# Patient Record
Sex: Female | Born: 1990 | Race: Black or African American | Hispanic: No | Marital: Single | Smoking: Never smoker
Health system: Southern US, Community
[De-identification: ages and names within clinical notes are randomized; demographics above are authoritative.]

## PROBLEM LIST (undated history)

## (undated) DIAGNOSIS — E0590 Thyrotoxicosis, unspecified without thyrotoxic crisis or storm: Secondary | ICD-10-CM

## (undated) DIAGNOSIS — D509 Iron deficiency anemia, unspecified: Secondary | ICD-10-CM

## (undated) DIAGNOSIS — R51 Headache: Secondary | ICD-10-CM

## (undated) DIAGNOSIS — R519 Headache, unspecified: Secondary | ICD-10-CM

## (undated) HISTORY — DX: Thyrotoxicosis, unspecified without thyrotoxic crisis or storm: E05.90

## (undated) HISTORY — DX: Iron deficiency anemia, unspecified: D50.9

---

## 2013-03-30 ENCOUNTER — Emergency Department (HOSPITAL_COMMUNITY)
Admission: EM | Admit: 2013-03-30 | Discharge: 2013-03-30 | Disposition: A | Discharge status: Home / Self Care | Payer: 59 | Attending: Emergency Medicine | Admitting: Emergency Medicine

## 2013-03-30 ENCOUNTER — Encounter (HOSPITAL_COMMUNITY): Payer: Self-pay | Admitting: Emergency Medicine

## 2013-03-30 DIAGNOSIS — K219 Gastro-esophageal reflux disease without esophagitis: Secondary | ICD-10-CM

## 2013-03-30 DIAGNOSIS — Z79899 Other long term (current) drug therapy: Secondary | ICD-10-CM | POA: Insufficient documentation

## 2013-03-30 DIAGNOSIS — Z3202 Encounter for pregnancy test, result negative: Secondary | ICD-10-CM | POA: Insufficient documentation

## 2013-03-30 LAB — CBC WITH DIFFERENTIAL/PLATELET
BASOS ABS: 0 10*3/uL (ref 0.0–0.1)
Basophils Relative: 0 % (ref 0–1)
EOS PCT: 2 % (ref 0–5)
Eosinophils Absolute: 0.1 10*3/uL (ref 0.0–0.7)
HCT: 37.6 % (ref 36.0–46.0)
Hemoglobin: 12.3 g/dL (ref 12.0–15.0)
LYMPHS PCT: 36 % (ref 12–46)
Lymphs Abs: 2.3 10*3/uL (ref 0.7–4.0)
MCH: 26.3 pg (ref 26.0–34.0)
MCHC: 32.7 g/dL (ref 30.0–36.0)
MCV: 80.3 fL (ref 78.0–100.0)
Monocytes Absolute: 0.5 10*3/uL (ref 0.1–1.0)
Monocytes Relative: 8 % (ref 3–12)
NEUTROS ABS: 3.5 10*3/uL (ref 1.7–7.7)
NEUTROS PCT: 54 % (ref 43–77)
PLATELETS: 242 10*3/uL (ref 150–400)
RBC: 4.68 MIL/uL (ref 3.87–5.11)
RDW: 14.8 % (ref 11.5–15.5)
WBC: 6.5 10*3/uL (ref 4.0–10.5)

## 2013-03-30 LAB — URINALYSIS, ROUTINE W REFLEX MICROSCOPIC
BILIRUBIN URINE: NEGATIVE
Glucose, UA: NEGATIVE mg/dL
Ketones, ur: NEGATIVE mg/dL
Leukocytes, UA: NEGATIVE
Nitrite: NEGATIVE
Protein, ur: NEGATIVE mg/dL
SPECIFIC GRAVITY, URINE: 1.027 (ref 1.005–1.030)
Urobilinogen, UA: 1 mg/dL (ref 0.0–1.0)
pH: 6 (ref 5.0–8.0)

## 2013-03-30 LAB — LIPASE, BLOOD: Lipase: 18 U/L (ref 11–59)

## 2013-03-30 LAB — COMPREHENSIVE METABOLIC PANEL
ALK PHOS: 83 U/L (ref 39–117)
ALT: 8 U/L (ref 0–35)
AST: 14 U/L (ref 0–37)
Albumin: 3 g/dL — ABNORMAL LOW (ref 3.5–5.2)
BUN: 15 mg/dL (ref 6–23)
CALCIUM: 8.7 mg/dL (ref 8.4–10.5)
CO2: 21 mEq/L (ref 19–32)
Chloride: 107 mEq/L (ref 96–112)
Creatinine, Ser: 0.89 mg/dL (ref 0.50–1.10)
GFR calc Af Amer: >90 mL/min (ref >90)
GFR calc non Af Amer: >90 mL/min (ref >90)
Glucose, Bld: 121 mg/dL — ABNORMAL HIGH (ref 70–99)
POTASSIUM: 3.9 meq/L (ref 3.7–5.3)
Sodium: 141 mEq/L (ref 137–147)
Total Bilirubin: <0.2 mg/dL — ABNORMAL LOW (ref 0.3–1.2)
Total Protein: 7.4 g/dL (ref 6.0–8.3)

## 2013-03-30 LAB — URINE MICROSCOPIC-ADD ON

## 2013-03-30 LAB — POCT I-STAT TROPONIN I: TROPONIN I, POC: 0 ng/mL (ref 0.00–0.08)

## 2013-03-30 LAB — POCT PREGNANCY, URINE: PREG TEST UR: NEGATIVE

## 2013-03-30 MED ORDER — GI COCKTAIL ~~LOC~~
30.0000 mL | Freq: Once | ORAL | Status: AC
  Administered 2013-03-30: 30 mL via ORAL
  Filled 2013-03-30: qty 30

## 2013-03-30 MED ORDER — KETOROLAC TROMETHAMINE 30 MG/ML IJ SOLN
30.0000 mg | Freq: Once | INTRAMUSCULAR | Status: AC
  Administered 2013-03-30: 30 mg via INTRAVENOUS
  Filled 2013-03-30: qty 1

## 2013-03-30 MED ORDER — ONDANSETRON HCL 4 MG/2ML IJ SOLN
4.0000 mg | Freq: Once | INTRAMUSCULAR | Status: AC
  Administered 2013-03-30: 4 mg via INTRAVENOUS
  Filled 2013-03-30: qty 2

## 2013-03-30 MED ORDER — DICYCLOMINE HCL 10 MG/ML IM SOLN
20.0000 mg | Freq: Once | INTRAMUSCULAR | Status: AC
  Administered 2013-03-30: 20 mg via INTRAMUSCULAR
  Filled 2013-03-30: qty 2

## 2013-03-30 MED ORDER — SUCRALFATE 1 GM/10ML PO SUSP
1.0000 g | Freq: Three times a day (TID) | ORAL | Status: DC
Start: 2013-03-30 — End: 2013-10-03

## 2013-03-30 NOTE — ED Provider Notes (Signed)
CSN: [id number]     Arrival date & time 03/30/13  0321 History   First MD Initiated Contact with Patient 03/30/13 [phone]     Chief Complaint  Patient presents with  . Abdominal Pain   (Consider location/radiation/quality/duration/timing/severity/associated sxs/prior Treatment) Patient is a 23 y.o. female presenting with abdominal pain. The history is provided by the patient. No language interpreter was used.  Abdominal Pain Pain location:  Epigastric (distribution of the colon) Pain quality: bloating and cramping   Pain radiates to:  Does not radiate Pain severity:  Moderate Onset quality:  Gradual Timing:  Constant Progression:  Unchanged Chronicity:  New Context: eating   Context comment:  Chilis hot wings and artichocke dip Relieved by:  Nothing Worsened by:  Nothing tried Ineffective treatments:  None tried Associated symptoms: vomiting   Associated symptoms: no anorexia   Risk factors: not pregnant     History reviewed. No pertinent past medical history. History reviewed. No pertinent past surgical history. No family history on file. History  Substance Use Topics  . Smoking status: Never Smoker   . Smokeless tobacco: Not on file  . Alcohol Use: No   OB History   Grav Para Term Preterm Abortions TAB SAB Ect Mult Living                 Review of Systems  Gastrointestinal: Positive for vomiting and abdominal pain. Negative for anorexia.  All other systems reviewed and are negative.    Allergies  Review of patient's allergies indicates no known allergies.  Home Medications   Current Outpatient Rx  Name  Route  Sig  Dispense  Refill  . AMETHIA 0.15-0.03 &0.01 MG tablet   Oral   Take 1 tablet by mouth daily.          BP 149/82  Pulse 71  Temp(Src) 97.9 F (36.6 C) (Oral)  Resp 18  Ht 5\' 6"  (1.676 m)  Wt 224 lb (101.606 kg)  BMI 36.17 kg/m2  SpO2 98%  LMP 01/27/2013 Physical Exam  Constitutional: She is oriented to person, place, and time. She  appears well-developed.  HENT:  Head: Normocephalic and atraumatic.  Mouth/Throat: Oropharynx is clear and moist.  Eyes: Conjunctivae are normal. Pupils are equal, round, and reactive to light.  Neck: Normal range of motion. Neck supple.  Cardiovascular: Normal rate and regular rhythm.   Pulmonary/Chest: Effort normal and breath sounds normal. She has no wheezes. She has no rales.  Abdominal: Soft. Bowel sounds are increased. There is no tenderness. There is no rigidity, no rebound, no guarding, no tenderness at McBurney's point and negative Murphy's sign.  Musculoskeletal: Normal range of motion.  Neurological: She is alert and oriented to person, place, and time.  Skin: Skin is warm and dry.    ED Course  Procedures (including critical care time) Labs Review Labs Reviewed  COMPREHENSIVE METABOLIC PANEL - Abnormal; Notable for the following:    Glucose, Bld 121 (*)    Albumin 3.0 (*)    Total Bilirubin <0.2 (*)    All other components within normal limits  URINALYSIS, ROUTINE W REFLEX MICROSCOPIC - Abnormal; Notable for the following:    APPearance CLOUDY (*)    Hgb urine dipstick TRACE (*)    All other components within normal limits  URINE MICROSCOPIC-ADD ON - Abnormal; Notable for the following:    Squamous Epithelial / LPF FEW (*)    Bacteria, UA FEW (*)    All other components within normal limits  CBC  WITH DIFFERENTIAL  LIPASE, BLOOD  POCT PREGNANCY, URINE  POCT I-STAT TROPONIN I   Imaging Review No results found.  EKG Interpretation    Date/Time:  Monday March 30 2013 03:39:59 EST Ventricular Rate:  83 PR Interval:  148 QRS Duration: 84 QT Interval:  368 QTC Calculation: 432 R Axis:   40 Text Interpretation:  Normal sinus rhythm Confirmed by [facility]  [facility], Garron Eline ([id number]) on 03/30/2013 4:35:29 AM            MDM  No diagnosis found. Symptoms consistent with GERD and gas.  No indication for CT at this time.  Will treat symptomatically return for any  worsening symptoms    Dmitriy Gair K Louis Ivery-Rasch, MD 03/30/13 [phone]

## 2013-03-30 NOTE — ED Notes (Signed)
C/o upper abd pain that woke her up approx 1 hour ago. Pt states it feels like gas pain. Vomited x 2.  Denies nausea at present.  Last BM Sunday- normal.

## 2013-03-30 NOTE — ED Notes (Signed)
Pt reports upper abd pain that woke her from sleep. Stated vomited x 2, denies sob, nausea, or diarrhea. Denies hx of same.

## 2013-03-30 NOTE — Discharge Instructions (Signed)

## 2013-10-03 ENCOUNTER — Encounter (HOSPITAL_COMMUNITY): Payer: Self-pay | Admitting: Emergency Medicine

## 2013-10-03 ENCOUNTER — Emergency Department (HOSPITAL_COMMUNITY)
Admission: EM | Admit: 2013-10-03 | Discharge: 2013-10-03 | Disposition: A | Discharge status: Home / Self Care | Payer: 59 | Source: Home / Self Care | Attending: Family Medicine | Admitting: Family Medicine

## 2013-10-03 ENCOUNTER — Emergency Department (HOSPITAL_COMMUNITY): Payer: 59

## 2013-10-03 ENCOUNTER — Emergency Department (HOSPITAL_COMMUNITY)
Admission: EM | Admit: 2013-10-03 | Discharge: 2013-10-03 | Disposition: A | Discharge status: Home / Self Care | Payer: 59 | Attending: Emergency Medicine | Admitting: Emergency Medicine

## 2013-10-03 DIAGNOSIS — R1011 Right upper quadrant pain: Secondary | ICD-10-CM | POA: Insufficient documentation

## 2013-10-03 DIAGNOSIS — R197 Diarrhea, unspecified: Secondary | ICD-10-CM | POA: Diagnosis not present

## 2013-10-03 DIAGNOSIS — Z79899 Other long term (current) drug therapy: Secondary | ICD-10-CM | POA: Insufficient documentation

## 2013-10-03 DIAGNOSIS — K8020 Calculus of gallbladder without cholecystitis without obstruction: Secondary | ICD-10-CM

## 2013-10-03 DIAGNOSIS — R112 Nausea with vomiting, unspecified: Secondary | ICD-10-CM

## 2013-10-03 DIAGNOSIS — R1013 Epigastric pain: Secondary | ICD-10-CM | POA: Diagnosis not present

## 2013-10-03 LAB — CBC WITH DIFFERENTIAL/PLATELET
BASOS ABS: 0 10*3/uL (ref 0.0–0.1)
Basophils Relative: 0 % (ref 0–1)
EOS PCT: 0 % (ref 0–5)
Eosinophils Absolute: 0 10*3/uL (ref 0.0–0.7)
HCT: 42.4 % (ref 36.0–46.0)
Hemoglobin: 13.9 g/dL (ref 12.0–15.0)
LYMPHS ABS: 0.7 10*3/uL (ref 0.7–4.0)
Lymphocytes Relative: 13 % (ref 12–46)
MCH: 27.1 pg (ref 26.0–34.0)
MCHC: 32.8 g/dL (ref 30.0–36.0)
MCV: 82.7 fL (ref 78.0–100.0)
Monocytes Absolute: 0.6 10*3/uL (ref 0.1–1.0)
Monocytes Relative: 12 % (ref 3–12)
Neutro Abs: 3.8 10*3/uL (ref 1.7–7.7)
Neutrophils Relative %: 75 % (ref 43–77)
PLATELETS: 254 10*3/uL (ref 150–400)
RBC: 5.13 MIL/uL — ABNORMAL HIGH (ref 3.87–5.11)
RDW: 14.2 % (ref 11.5–15.5)
WBC: 5.1 10*3/uL (ref 4.0–10.5)

## 2013-10-03 LAB — COMPREHENSIVE METABOLIC PANEL
ALT: 18 U/L (ref 0–35)
AST: 23 U/L (ref 0–37)
Albumin: 3.8 g/dL (ref 3.5–5.2)
Alkaline Phosphatase: 99 U/L (ref 39–117)
Anion gap: 14 (ref 5–15)
BUN: 10 mg/dL (ref 6–23)
CALCIUM: 9.6 mg/dL (ref 8.4–10.5)
CO2: 23 mEq/L (ref 19–32)
Chloride: 100 mEq/L (ref 96–112)
Creatinine, Ser: 0.79 mg/dL (ref 0.50–1.10)
GFR calc non Af Amer: >90 mL/min (ref >90)
GLUCOSE: 95 mg/dL (ref 70–99)
Potassium: 4.1 mEq/L (ref 3.7–5.3)
SODIUM: 137 meq/L (ref 137–147)
TOTAL PROTEIN: 8.3 g/dL (ref 6.0–8.3)
Total Bilirubin: 0.2 mg/dL — ABNORMAL LOW (ref 0.3–1.2)

## 2013-10-03 LAB — POCT URINALYSIS DIP (DEVICE)
Bilirubin Urine: NEGATIVE
Glucose, UA: NEGATIVE mg/dL
HGB URINE DIPSTICK: NEGATIVE
Ketones, ur: NEGATIVE mg/dL
Leukocytes, UA: NEGATIVE
NITRITE: NEGATIVE
PH: 7 (ref 5.0–8.0)
Protein, ur: NEGATIVE mg/dL
Specific Gravity, Urine: 1.02 (ref 1.005–1.030)
UROBILINOGEN UA: 0.2 mg/dL (ref 0.0–1.0)

## 2013-10-03 LAB — LIPASE, BLOOD: Lipase: 19 U/L (ref 11–59)

## 2013-10-03 LAB — POCT PREGNANCY, URINE: Preg Test, Ur: NEGATIVE

## 2013-10-03 MED ORDER — MORPHINE SULFATE 4 MG/ML IJ SOLN
4.0000 mg | Freq: Once | INTRAMUSCULAR | Status: AC
  Administered 2013-10-03: 4 mg via INTRAVENOUS
  Filled 2013-10-03: qty 1

## 2013-10-03 MED ORDER — DICYCLOMINE HCL 20 MG PO TABS: 20.0000 mg | ORAL_TABLET | Freq: Two times a day (BID) | ORAL | Status: DC

## 2013-10-03 MED ORDER — ONDANSETRON HCL 4 MG/2ML IJ SOLN
4.0000 mg | Freq: Once | INTRAMUSCULAR | Status: AC
  Administered 2013-10-03: 4 mg via INTRAVENOUS
  Filled 2013-10-03: qty 2

## 2013-10-03 MED ORDER — HYDROCODONE-ACETAMINOPHEN 5-325 MG PO TABS: 1.0000 | ORAL_TABLET | Freq: Four times a day (QID) | ORAL | Status: DC | PRN

## 2013-10-03 MED ORDER — SODIUM CHLORIDE 0.9 % IV BOLUS (SEPSIS)
1000.0000 mL | Freq: Once | INTRAVENOUS | Status: AC
  Administered 2013-10-03: 1000 mL via INTRAVENOUS

## 2013-10-03 MED ORDER — DICYCLOMINE HCL 10 MG/ML IM SOLN
20.0000 mg | Freq: Once | INTRAMUSCULAR | Status: AC
  Administered 2013-10-03: 20 mg via INTRAMUSCULAR
  Filled 2013-10-03: qty 2

## 2013-10-03 NOTE — ED Notes (Signed)
Brought pt back to room with family in tow; pt getting undressed and into a gown at this time; family at bedside; Lowella BandyNikki, RN aware of pt

## 2013-10-03 NOTE — ED Notes (Signed)
Pt c/o abd pain onset 0500 today Sx include n/v Denise fevers, urinary sx Alert w/no signs of acute distress.

## 2013-10-03 NOTE — ED Provider Notes (Signed)
CSN: [id number]     Arrival date & time 10/03/13  1122 History   First MD Initiated Contact with Patient 10/03/13 1153     Chief Complaint  Patient presents with  . Abdominal Pain   (Consider location/radiation/quality/duration/timing/severity/associated sxs/prior Treatment) HPI Comments: 23 year old female presents for evaluation of abdominal pain. She had acute onset of upper abdominal pain at 5:00 this morning that woke her from her sleep. The pain is described as sharp and stabbing. It was soon followed by nausea and vomiting, and since then she has developed diarrhea. She has had multiple episodes of vomiting today. The pain is located in the epigastrium and right upper quadrant and is nonradiating. She has not had any fever at home. She has never had pain like this in the past. Vomitus is nonbilious  Patient is a 23 y.o. female presenting with abdominal pain.  Abdominal Pain Associated symptoms: diarrhea, nausea and vomiting     History reviewed. No pertinent past medical history. History reviewed. No pertinent past surgical history. No family history on file. History  Substance Use Topics  . Smoking status: Never Smoker   . Smokeless tobacco: Not on file  . Alcohol Use: No   OB History   Grav Para Term Preterm Abortions TAB SAB Ect Mult Living                 Review of Systems  Gastrointestinal: Positive for nausea, vomiting, abdominal pain and diarrhea. Negative for blood in stool.  All other systems reviewed and are negative.   Allergies  Review of patient's allergies indicates no known allergies.  Home Medications   Prior to Admission medications   Medication Sig Start Date End Date Taking? Authorizing Provider  AMETHIA 0.15-0.03 &0.01 MG tablet Take 1 tablet by mouth daily. 02/01/13  Yes Historical Provider, MD  sucralfate (CARAFATE) 1 GM/10ML suspension Take 10 mLs (1 g total) by mouth 4 (four) times daily -  with meals and at bedtime. 03/30/13   April K  Palumbo-Rasch, MD   BP 135/87  Pulse 61  Temp(Src) 98.6 F (37 C) (Oral)  Resp 16  SpO2 100%  LMP 08/08/2013 Physical Exam  Nursing note and vitals reviewed. Constitutional: She is oriented to person, place, and time. Vital signs are normal. She appears well-developed and well-nourished. No distress.  HENT:  Head: Normocephalic and atraumatic.  Cardiovascular: Normal rate, regular rhythm and normal heart sounds.   Pulmonary/Chest: Effort normal and breath sounds normal. No respiratory distress.  Abdominal: Soft. Normal appearance. Bowel sounds are decreased. There is no hepatosplenomegaly. There is tenderness in the right upper quadrant and epigastric area. There is positive Murphy's sign. There is no rigidity, no rebound, no guarding, no CVA tenderness and no tenderness at McBurney's point. No hernia.  Neurological: She is alert and oriented to person, place, and time. She has normal strength. Coordination normal.  Skin: Skin is warm and dry. No rash noted. She is not diaphoretic.  Psychiatric: She has a normal mood and affect. Judgment normal.    ED Course  Procedures (including critical care time) Labs Review Labs Reviewed  POCT URINALYSIS DIP (DEVICE)  POCT PREGNANCY, URINE    Imaging Review No results found.   MDM   1. Right upper quadrant pain   2. Nausea vomiting and diarrhea    Abdominal pain proceeded the vomiting and diarrhea, cannot r/o acute abdomen without US or CT  as part of workup, transferred to ED via shuttle .  Graylon Good, PA-C 10/03/13 1248

## 2013-10-03 NOTE — ED Provider Notes (Signed)
Medical screening examination/treatment/procedure(s) were performed by resident physician or non-physician practitioner and as supervising physician I was immediately available for consultation/collaboration.   Barkley BrunsKINDL,Charistopher Rumble DOUGLAS MD.   Linna HoffJames D Oprah Camarena, MD 10/03/13 [phone]

## 2013-10-03 NOTE — ED Provider Notes (Signed)
CSN: [id number]     Arrival date & time 10/03/13  1251 History   None    Chief Complaint  Patient presents with  . Abdominal Pain     (Consider location/radiation/quality/duration/timing/severity/associated sxs/prior Treatment) HPI Comments: Patient is a 23 year old female who presents to the emergency department today from urgent care for evaluation of abdominal pain. She developed a sharp RUQ abdominal pain at 5am which awoke her from sleep. The pain has been constant since she woke up. Nothing seems to make her pain worse. She has not had any medications to improve her pain. She has associated nausea, vomiting, diarrhea. Diarrhea is watery. Emesis is nonbilious, non bloody. She denies fevers or chills. She has never had pain like this in the past. No dysuria, urinary urgency, urinary frequency, vaginal discharge, vaginal bleeding.  She denies any abdominal surgeries.  Patient is a 23 y.o. female presenting with abdominal pain. The history is provided by the patient. No language interpreter was used.  Abdominal Pain Associated symptoms: diarrhea, nausea and vomiting   Associated symptoms: no chest pain, no chills, no dysuria, no fever, no shortness of breath, no vaginal bleeding and no vaginal discharge     History reviewed. No pertinent past medical history. History reviewed. No pertinent past surgical history. No family history on file. History  Substance Use Topics  . Smoking status: Never Smoker   . Smokeless tobacco: Not on file  . Alcohol Use: No   OB History   Grav Para Term Preterm Abortions TAB SAB Ect Mult Living                 Review of Systems  Constitutional: Negative for fever and chills.  Respiratory: Negative for shortness of breath.   Cardiovascular: Negative for chest pain.  Gastrointestinal: Positive for nausea, vomiting, abdominal pain and diarrhea.  Genitourinary: Negative for dysuria, vaginal bleeding, vaginal discharge, difficulty urinating and genital  sores.  All other systems reviewed and are negative.     Allergies  Review of patient's allergies indicates no known allergies.  Home Medications   Prior to Admission medications   Medication Sig Start Date End Date Taking? Authorizing Provider  AMETHIA 0.15-0.03 &0.01 MG tablet Take 1 tablet by mouth daily. 02/01/13  Yes Historical Provider, MD   BP 129/82  Pulse 63  Temp(Src) 98.2 F (36.8 C) (Oral)  Resp 18  SpO2 100%  LMP 08/08/2013 Physical Exam  Nursing note and vitals reviewed. Constitutional: She is oriented to person, place, and time. She appears well-developed and well-nourished. No distress.  HENT:  Head: Normocephalic and atraumatic.  Right Ear: External ear normal.  Left Ear: External ear normal.  Nose: Nose normal.  Mouth/Throat: Oropharynx is clear and moist.  Eyes: Conjunctivae are normal.  Neck: Normal range of motion.  Cardiovascular: Normal rate, regular rhythm and normal heart sounds.   Pulmonary/Chest: Effort normal and breath sounds normal. No stridor. No respiratory distress. She has no wheezes. She has no rales.  Abdominal: Soft. She exhibits no distension. There is tenderness in the right upper quadrant and epigastric area. There is no rigidity, no rebound, no guarding and negative Murphy's sign.  Musculoskeletal: Normal range of motion.  Neurological: She is alert and oriented to person, place, and time. She has normal strength.  Skin: Skin is warm and dry. She is not diaphoretic. No erythema.  Psychiatric: She has a normal mood and affect. Her behavior is normal.    ED Course  Procedures (including critical care time) Labs Review  Labs Reviewed  CBC WITH DIFFERENTIAL - Abnormal; Notable for the following:    RBC 5.13 (*)    All other components within normal limits  COMPREHENSIVE METABOLIC PANEL - Abnormal; Notable for the following:    Total Bilirubin 0.2 (*)    All other components within normal limits  LIPASE, BLOOD    Imaging  Review US Abdomen Complete  10/03/2013   CLINICAL DATA:  Right upper quadrant abdominal pain.  EXAM: ULTRASOUND ABDOMEN COMPLETE  COMPARISON:  None.  FINDINGS: Gallbladder:  Gallstones, measuring up to 2.0 cm. No wall thickening or pericholecystic fluid. Sonographic Murphy's sign was not elicited.  Common bile duct:  Diameter: Normal, 4 mm.  Liver:  No focal lesion identified. Within normal limits in parenchymal echogenicity.  IVC:  No abnormality visualized.  Pancreas:  Poorly visualized due to overlying bowel gas.  Spleen:  Size and appearance within normal limits.  Right Kidney:  Length: 11.4 cm No hydronephrosis.  Left Kidney:  Length: 10.7 cm. No hydronephrosis. Partially obscured due to overlying bowel gas.  Abdominal aorta:  No aneurysm visualized.  Other findings:  None.  IMPRESSION: Cholelithiasis without acute cholecystitis or biliary ductal dilatation.   Electronically Signed   By: Jeronimo Greaves M.D.   On: 10/03/2013 17:06     EKG Interpretation None      MDM   Final diagnoses:  Calculus of gallbladder without cholecystitis without obstruction   Patient is nontoxic, nonseptic appearing, in no apparent distress.  Patient's pain and other symptoms adequately managed in emergency department. Labs, imaging and vitals reviewed.  Patient does not meet the SIRS or Sepsis criteria.  On repeat exam patient does not have a surgical abdomen and there are no peritoneal signs.  No indication of appendicitis, bowel obstruction, bowel perforation, cholecystitis, diverticulitis, PID or ectopic pregnancy.  Urine pregnancy and UA unremarkable at [facility]. Symptoms likely due to cholelithiasis. Discussed this with patient. Patient discharged home with symptomatic treatment. Will give surgery referral.  I have also discussed reasons to return immediately to the ER.  Patient expresses understanding and agrees with plan. Discussed case with Dr. Clarene Duke who agrees with plan. Patient / Family / Caregiver informed of  clinical course, understand medical decision-making process, and agree with plan.        Mora Bellman, PA-C 10/03/13 [phone]

## 2013-10-03 NOTE — ED Notes (Signed)
[facility] reports sudden onset of 10/10 RUQ "sharp" abdominal pain at 0500 today associated with N/V/D. Pt in NAD. Denies fever/chills.

## 2013-10-03 NOTE — ED Notes (Signed)
PA at bedside.

## 2013-10-03 NOTE — Discharge Instructions (Signed)
Low-Fat Diet for Pancreatitis or Gallbladder Conditions °A low-fat diet can be helpful if you have pancreatitis or a gallbladder condition. With these conditions, your pancreas and gallbladder have trouble digesting fats. A healthy eating plan with less fat will help rest your pancreas and gallbladder and reduce your symptoms. °WHAT DO I NEED TO KNOW ABOUT THIS DIET? °· Eat a low-fat diet. °¨ Reduce your fat intake to less than 20-30% of your total daily calories. This is less than 50-60 g of fat per day. °¨ Remember that you need some fat in your diet. Ask your dietician what your daily goal should be. °¨ Choose nonfat and low-fat healthy foods. Look for the words "nonfat," "low fat," or "fat free." °¨ As a guide, look on the label and choose foods with less than 3 g of fat per serving. Eat only one serving. °· Avoid alcohol. °· Do not smoke. If you need help quitting, talk with your health care provider. °· Eat small frequent meals instead of three large heavy meals. °WHAT FOODS CAN I EAT? °Grains °Include healthy grains and starches such as potatoes, wheat bread, fiber-rich cereal, and brown rice. Choose whole grain options whenever possible. In adults, whole grains should account for 45-65% of your daily calories.  °Fruits and Vegetables °Eat plenty of fruits and vegetables. Fresh fruits and vegetables add fiber to your diet. °Meats and Other Protein Sources °Eat lean meat such as chicken and pork. Trim any fat off of meat before cooking it. Eggs, fish, and beans are other sources of protein. In adults, these foods should account for 10-35% of your daily calories. °Dairy °Choose low-fat milk and dairy options. Dairy includes fat and protein, as well as calcium.  °Fats and Oils °Limit high-fat foods such as fried foods, sweets, baked goods, sugary drinks.  °Other °Creamy sauces and condiments, such as mayonnaise, can add extra fat. Think about whether or not you need to use them, or use smaller amounts or low fat  options. °WHAT FOODS ARE NOT RECOMMENDED? °· High fat foods, such as: °¨ Baked goods. °¨ Ice cream. °¨ French toast. °¨ Sweet rolls. °¨ Pizza. °¨ Cheese bread. °¨ Foods covered with batter, butter, creamy sauces, or cheese. °¨ Fried foods. °¨ Sugary drinks and desserts. °· Foods that cause gas or bloating °Document Released: 02/10/2013 Document Reviewed: 02/10/2013 °[employer]® Patient Information ©2015 [employer]. This information is not intended to replace advice given to you by your health care provider. Make sure you discuss any questions you have with your health care provider. °Cholelithiasis °Cholelithiasis (also called gallstones) is a form of gallbladder disease. The gallbladder is a small organ that helps you digest fats. Symptoms of gallstones are: °· Feeling sick to your stomach (nausea). °· Throwing up (vomiting). °· Belly pain. °· Yellowing of the skin (jaundice). °· Sudden pain. You may feel the pain for minutes to hours. °· Fever. °· Pain to the touch. °HOME CARE °· Only take medicines as told by your doctor. °· Eat a low-fat diet until you see your doctor again. Eating fat can result in pain. °· Follow up with your doctor as told. Attacks usually happen time after time. Surgery is usually needed for permanent treatment. °GET HELP RIGHT AWAY IF:  °· Your pain gets worse. °· Your pain is not helped by medicines. °· You have a fever and lasting symptoms for more than 2-3 days. °· You have a fever and your symptoms suddenly get worse. °· You keep feeling sick to your   stomach and throwing up. °MAKE SURE YOU:  °· Understand these instructions. °· Will watch your condition. °· Will get help right away if you are not doing well or get worse. °Document Released: 07/25/2007 Document Revised: 10/08/2012 Document Reviewed: 07/30/2012 °Exit[employer]® Patient Information ©2015 [employer]. This information is not intended to replace advice given to you by your health care provider. Make sure you discuss any  questions you have with your health care provider. ° °

## 2013-10-05 NOTE — ED Provider Notes (Signed)
Medical screening examination/treatment/procedure(s) were performed by non-physician practitioner and as supervising physician I was immediately available for consultation/collaboration.   EKG Interpretation None        Samuel JesterKathleen Mahmud Keithly, DO 10/05/13 1543

## 2013-10-22 ENCOUNTER — Ambulatory Visit (INDEPENDENT_AMBULATORY_CARE_PROVIDER_SITE_OTHER): Payer: 59 | Admitting: General Surgery

## 2013-11-20 ENCOUNTER — Encounter (HOSPITAL_COMMUNITY): Payer: Self-pay | Admitting: Pharmacy Technician

## 2013-11-24 ENCOUNTER — Other Ambulatory Visit (HOSPITAL_COMMUNITY): Payer: Self-pay | Admitting: *Deleted

## 2013-11-24 NOTE — Pre-Procedure Instructions (Signed)
Susan GambleMarlayna J Schneider  11/24/2013   Your procedure is scheduled on:  Tuesday, December 01, 2013 at 12:10 PM.   [facility] at 10:10 AM.   Call this number if you have problems the morning of surgery: [phone]   Remember:   Do not eat food or drink liquids after midnight Monday, 11/30/13.   Take these medicines the morning of surgery with A SIP OF WATER: Levonorgestrel-Ethinyl Estradiol (AMETHIA)    Do not wear jewelry, make-up or nail polish.  Do not wear lotions, powders, or perfumes. You may wear deodorant.  Do not shave 48 hours prior to surgery.   Do not bring valuables to the hospital.  [facility]                  for any belongings or valuables.               Contacts, dentures or bridgework may not be worn into surgery.  Leave suitcase in the car. After surgery it may be brought to your room.  For patients admitted to the hospital, discharge time is determined by your                treatment team.               Patients discharged the day of surgery will not be allowed to drive home.    Special Instructions: [facility] - Preparing for Surgery  Before surgery, you can play an important role.  Because skin is not sterile, your skin needs to be as free of germs as possible.  You can reduce the number of germs on you skin by washing with CHG (chlorahexidine gluconate) soap before surgery.  CHG is an antiseptic cleaner which kills germs and bonds with the skin to continue killing germs even after washing.  Please DO NOT use if you have an allergy to CHG or antibacterial soaps.  If your skin becomes reddened/irritated stop using the CHG and inform your nurse when you arrive at Short Stay.  Do not shave (including legs and underarms) for at least 48 hours prior to the first CHG shower.  You may shave your face.  Please follow these instructions carefully:   1.  Shower with CHG Soap the night before surgery and  the                                morning of Surgery.  2.  If you choose to wash your hair, wash your hair first as usual with your       normal shampoo.  3.  After you shampoo, rinse your hair and body thoroughly to remove the                      Shampoo.  4.  Use CHG as you would any other liquid soap.  You can apply chg directly       to the skin and wash gently with scrungie or a clean washcloth.  5.  Apply the CHG Soap to your body ONLY FROM THE NECK DOWN.        Do not use on open wounds or open sores.  Avoid contact with your eyes, ears, mouth and genitals (private parts).  Wash genitals (private parts) with your normal soap.  6.  Wash thoroughly, paying special attention to the  area where your surgery        will be performed.  7.  Thoroughly rinse your body with warm water from the neck down.  8.  DO NOT shower/wash with your normal soap after using and rinsing off       the CHG Soap.  9.  Pat yourself dry with a clean towel.            10.  Wear clean pajamas.            11.  Place clean sheets on your bed the night of your first shower and do not        sleep with pets.  Day of Surgery  Do not apply any lotions the morning of surgery.  Please wear clean clothes to the hospital.     Please read over the following fact sheets that you were given: Pain Booklet, Coughing and Deep Breathing and Surgical Site Infection Prevention

## 2013-11-25 ENCOUNTER — Encounter (HOSPITAL_COMMUNITY)
Admission: RE | Admit: 2013-11-25 | Discharge: 2013-11-25 | Disposition: A | Discharge status: Home / Self Care | Payer: 59 | Source: Ambulatory Visit | Attending: General Surgery | Admitting: General Surgery

## 2013-11-25 ENCOUNTER — Encounter (HOSPITAL_COMMUNITY): Payer: Self-pay

## 2013-11-25 DIAGNOSIS — R51 Headache: Secondary | ICD-10-CM | POA: Diagnosis not present

## 2013-11-25 DIAGNOSIS — K8020 Calculus of gallbladder without cholecystitis without obstruction: Secondary | ICD-10-CM | POA: Diagnosis not present

## 2013-11-25 DIAGNOSIS — Z0000 Encounter for general adult medical examination without abnormal findings: Secondary | ICD-10-CM | POA: Insufficient documentation

## 2013-11-25 HISTORY — DX: Headache, unspecified: R51.9

## 2013-11-25 HISTORY — DX: Headache: R51

## 2013-11-25 LAB — CBC
HEMATOCRIT: 40.9 % (ref 36.0–46.0)
Hemoglobin: 13.4 g/dL (ref 12.0–15.0)
MCH: 27.3 pg (ref 26.0–34.0)
MCHC: 32.8 g/dL (ref 30.0–36.0)
MCV: 83.3 fL (ref 78.0–100.0)
Platelets: 256 10*3/uL (ref 150–400)
RBC: 4.91 MIL/uL (ref 3.87–5.11)
RDW: 13.9 % (ref 11.5–15.5)
WBC: 5.7 10*3/uL (ref 4.0–10.5)

## 2013-11-25 LAB — HCG, SERUM, QUALITATIVE: Preg, Serum: NEGATIVE

## 2013-11-26 ENCOUNTER — Other Ambulatory Visit (INDEPENDENT_AMBULATORY_CARE_PROVIDER_SITE_OTHER): Payer: Self-pay | Admitting: General Surgery

## 2013-11-30 MED ORDER — CEFOXITIN SODIUM 2 G IV SOLR
2.0000 g | INTRAVENOUS | Status: AC
  Administered 2013-12-01: 2 g via INTRAVENOUS
  Filled 2013-11-30 (×2): qty 2

## 2013-12-01 ENCOUNTER — Encounter (HOSPITAL_COMMUNITY): Payer: Self-pay

## 2013-12-01 ENCOUNTER — Ambulatory Visit (HOSPITAL_COMMUNITY): Payer: 59 | Admitting: Anesthesiology

## 2013-12-01 ENCOUNTER — Encounter (HOSPITAL_COMMUNITY): Payer: 59 | Admitting: Anesthesiology

## 2013-12-01 ENCOUNTER — Encounter (HOSPITAL_COMMUNITY)
Admission: RE | Disposition: A | Discharge status: Home / Self Care | Payer: Self-pay | Source: Ambulatory Visit | Attending: General Surgery

## 2013-12-01 ENCOUNTER — Ambulatory Visit (HOSPITAL_COMMUNITY)
Admission: RE | Admit: 2013-12-01 | Discharge: 2013-12-01 | Disposition: A | Discharge status: Home / Self Care | Payer: 59 | Source: Ambulatory Visit | Attending: General Surgery | Admitting: General Surgery

## 2013-12-01 ENCOUNTER — Ambulatory Visit (HOSPITAL_COMMUNITY): Payer: 59

## 2013-12-01 DIAGNOSIS — K8020 Calculus of gallbladder without cholecystitis without obstruction: Secondary | ICD-10-CM

## 2013-12-01 DIAGNOSIS — K828 Other specified diseases of gallbladder: Secondary | ICD-10-CM | POA: Diagnosis not present

## 2013-12-01 DIAGNOSIS — K8010 Calculus of gallbladder with chronic cholecystitis without obstruction: Secondary | ICD-10-CM | POA: Diagnosis present

## 2013-12-01 HISTORY — PX: CHOLECYSTECTOMY: SHX55

## 2013-12-01 SURGERY — LAPAROSCOPIC CHOLECYSTECTOMY WITH INTRAOPERATIVE CHOLANGIOGRAM
Anesthesia: General | Site: Abdomen

## 2013-12-01 MED ORDER — KETOROLAC TROMETHAMINE 30 MG/ML IJ SOLN
INTRAMUSCULAR | Status: AC
  Filled 2013-12-01: qty 1

## 2013-12-01 MED ORDER — LIDOCAINE HCL (CARDIAC) 20 MG/ML IV SOLN
INTRAVENOUS | Status: AC
  Filled 2013-12-01: qty 5

## 2013-12-01 MED ORDER — 0.9 % SODIUM CHLORIDE (POUR BTL) OPTIME
TOPICAL | Status: DC | PRN
  Administered 2013-12-01: 1000 mL

## 2013-12-01 MED ORDER — PHENYLEPHRINE HCL 10 MG/ML IJ SOLN
INTRAMUSCULAR | Status: DC | PRN
  Administered 2013-12-01: 120 ug via INTRAVENOUS

## 2013-12-01 MED ORDER — LIDOCAINE HCL 4 % MT SOLN
OROMUCOSAL | Status: DC | PRN
  Administered 2013-12-01: 3 mL via TOPICAL

## 2013-12-01 MED ORDER — SODIUM CHLORIDE 0.9 % IV SOLN: INTRAVENOUS | Status: DC | PRN

## 2013-12-01 MED ORDER — PROPOFOL 10 MG/ML IV BOLUS
INTRAVENOUS | Status: DC | PRN
  Administered 2013-12-01: 200 mg via INTRAVENOUS

## 2013-12-01 MED ORDER — NEOSTIGMINE METHYLSULFATE 10 MG/10ML IV SOLN
INTRAVENOUS | Status: DC | PRN
  Administered 2013-12-01: 3 mg via INTRAVENOUS

## 2013-12-01 MED ORDER — LIDOCAINE HCL (CARDIAC) 20 MG/ML IV SOLN
INTRAVENOUS | Status: DC | PRN
  Administered 2013-12-01: 60 mg via INTRAVENOUS

## 2013-12-01 MED ORDER — HYDROMORPHONE HCL 1 MG/ML IJ SOLN
0.2500 mg | INTRAMUSCULAR | Status: DC | PRN
  Administered 2013-12-01 (×2): 0.5 mg via INTRAVENOUS

## 2013-12-01 MED ORDER — ONDANSETRON HCL 4 MG/2ML IJ SOLN
INTRAMUSCULAR | Status: AC
  Filled 2013-12-01: qty 6

## 2013-12-01 MED ORDER — ONDANSETRON HCL 4 MG/2ML IJ SOLN
INTRAMUSCULAR | Status: DC | PRN
  Administered 2013-12-01 (×2): 4 mg via INTRAVENOUS

## 2013-12-01 MED ORDER — BUPIVACAINE-EPINEPHRINE (PF) 0.25% -1:200000 IJ SOLN
INTRAMUSCULAR | Status: AC
  Filled 2013-12-01: qty 30

## 2013-12-01 MED ORDER — OXYCODONE-ACETAMINOPHEN 5-325 MG PO TABS: 1.0000 | ORAL_TABLET | ORAL | Status: DC | PRN

## 2013-12-01 MED ORDER — HYDROMORPHONE HCL 1 MG/ML IJ SOLN
INTRAMUSCULAR | Status: AC
  Filled 2013-12-01: qty 1

## 2013-12-01 MED ORDER — ONDANSETRON HCL 4 MG/2ML IJ SOLN
INTRAMUSCULAR | Status: AC
  Filled 2013-12-01: qty 2

## 2013-12-01 MED ORDER — MIDAZOLAM HCL 2 MG/2ML IJ SOLN
INTRAMUSCULAR | Status: AC
Start: 2013-12-01 — End: 2013-12-01
  Filled 2013-12-01: qty 2

## 2013-12-01 MED ORDER — EPHEDRINE SULFATE 50 MG/ML IJ SOLN
INTRAMUSCULAR | Status: DC | PRN
  Administered 2013-12-01: 10 mg via INTRAVENOUS

## 2013-12-01 MED ORDER — FENTANYL CITRATE 0.05 MG/ML IJ SOLN
INTRAMUSCULAR | Status: AC
  Filled 2013-12-01: qty 5

## 2013-12-01 MED ORDER — MIDAZOLAM HCL 5 MG/5ML IJ SOLN
INTRAMUSCULAR | Status: DC | PRN
  Administered 2013-12-01: 2 mg via INTRAVENOUS

## 2013-12-01 MED ORDER — IOHEXOL 300 MG/ML  SOLN
INTRAMUSCULAR | Status: DC | PRN
  Administered 2013-12-01: 50 mL via INTRAVENOUS

## 2013-12-01 MED ORDER — BUPIVACAINE-EPINEPHRINE 0.25% -1:200000 IJ SOLN
INTRAMUSCULAR | Status: DC | PRN
  Administered 2013-12-01: 30 mL

## 2013-12-01 MED ORDER — LACTATED RINGERS IV SOLN
INTRAVENOUS | Status: DC
Start: 2013-12-01 — End: 2013-12-01
  Administered 2013-12-01: 10:00:00 via INTRAVENOUS

## 2013-12-01 MED ORDER — ROCURONIUM BROMIDE 100 MG/10ML IV SOLN
INTRAVENOUS | Status: DC | PRN
  Administered 2013-12-01: 10 mg via INTRAVENOUS
  Administered 2013-12-01: 5 mg via INTRAVENOUS
  Administered 2013-12-01: 25 mg via INTRAVENOUS

## 2013-12-01 MED ORDER — KETOROLAC TROMETHAMINE 30 MG/ML IJ SOLN
INTRAMUSCULAR | Status: DC | PRN
  Administered 2013-12-01: 30 mg via INTRAVENOUS

## 2013-12-01 MED ORDER — GLYCOPYRROLATE 0.2 MG/ML IJ SOLN
INTRAMUSCULAR | Status: DC | PRN
  Administered 2013-12-01: .4 mg via INTRAVENOUS

## 2013-12-01 MED ORDER — FENTANYL CITRATE 0.05 MG/ML IJ SOLN
INTRAMUSCULAR | Status: DC | PRN
  Administered 2013-12-01: 25 ug via INTRAVENOUS
  Administered 2013-12-01: 125 ug via INTRAVENOUS
  Administered 2013-12-01: 25 ug via INTRAVENOUS
  Administered 2013-12-01: 75 ug via INTRAVENOUS

## 2013-12-01 MED ORDER — ONDANSETRON HCL 4 MG/2ML IJ SOLN
4.0000 mg | Freq: Once | INTRAMUSCULAR | Status: AC | PRN
  Administered 2013-12-01: 4 mg via INTRAVENOUS

## 2013-12-01 MED ORDER — LACTATED RINGERS IV SOLN
INTRAVENOUS | Status: DC | PRN
  Administered 2013-12-01: 11:00:00 via INTRAVENOUS

## 2013-12-01 MED ORDER — SODIUM CHLORIDE 0.9 % IR SOLN
Status: DC | PRN
  Administered 2013-12-01: 1000 mL

## 2013-12-01 SURGICAL SUPPLY — 47 items
APPLIER CLIP 5 13 M/L LIGAMAX5 (MISCELLANEOUS) ×2
BANDAGE ADH SHEER 1  50/CT (GAUZE/BANDAGES/DRESSINGS) ×2 IMPLANT
BENZOIN TINCTURE PRP APPL 2/3 (GAUZE/BANDAGES/DRESSINGS) ×2 IMPLANT
BLADE SURG ROTATE 9660 (MISCELLANEOUS) ×2 IMPLANT
CANISTER SUCTION 2500CC (MISCELLANEOUS) ×2 IMPLANT
CHLORAPREP W/TINT 26ML (MISCELLANEOUS) ×2 IMPLANT
CLIP APPLIE 5 13 M/L LIGAMAX5 (MISCELLANEOUS) ×1 IMPLANT
COVER MAYO STAND STRL (DRAPES) ×2 IMPLANT
COVER SURGICAL LIGHT HANDLE (MISCELLANEOUS) ×2 IMPLANT
DECANTER SPIKE VIAL GLASS SM (MISCELLANEOUS) IMPLANT
DRAPE C-ARM 42X72 X-RAY (DRAPES) ×2 IMPLANT
DRAPE UTILITY 15X26 W/TAPE STR (DRAPE) IMPLANT
DRSG TEGADERM 4X4.75 (GAUZE/BANDAGES/DRESSINGS) ×2 IMPLANT
ELECT REM PT RETURN 9FT ADLT (ELECTROSURGICAL) ×2
ELECTRODE REM PT RTRN 9FT ADLT (ELECTROSURGICAL) ×1 IMPLANT
GAUZE SPONGE 2X2 8PLY STRL LF (GAUZE/BANDAGES/DRESSINGS) ×1 IMPLANT
GLOVE BIO SURGEON STRL SZ 6.5 (GLOVE) ×2 IMPLANT
GLOVE BIO SURGEON STRL SZ8 (GLOVE) ×2 IMPLANT
GLOVE BIOGEL M STRL SZ7.5 (GLOVE) ×2 IMPLANT
GLOVE BIOGEL PI IND STRL 7.0 (GLOVE) ×1 IMPLANT
GLOVE BIOGEL PI IND STRL 8 (GLOVE) ×3 IMPLANT
GLOVE BIOGEL PI INDICATOR 7.0 (GLOVE) ×1
GLOVE BIOGEL PI INDICATOR 8 (GLOVE) ×3
GOWN STRL REUS W/ TWL LRG LVL3 (GOWN DISPOSABLE) ×2 IMPLANT
GOWN STRL REUS W/ TWL XL LVL3 (GOWN DISPOSABLE) ×1 IMPLANT
GOWN STRL REUS W/TWL LRG LVL3 (GOWN DISPOSABLE) ×2
GOWN STRL REUS W/TWL XL LVL3 (GOWN DISPOSABLE) ×1
KIT BASIN OR (CUSTOM PROCEDURE TRAY) ×2 IMPLANT
KIT ROOM TURNOVER OR (KITS) ×2 IMPLANT
NS IRRIG 1000ML POUR BTL (IV SOLUTION) ×2 IMPLANT
PAD ARMBOARD 7.5X6 YLW CONV (MISCELLANEOUS) ×2 IMPLANT
POUCH SPECIMEN RETRIEVAL 10MM (ENDOMECHANICALS) ×2 IMPLANT
SCISSORS LAP 5X35 DISP (ENDOMECHANICALS) ×2 IMPLANT
SET CHOLANGIOGRAPH 5 50 .035 (SET/KITS/TRAYS/PACK) ×2 IMPLANT
SET IRRIG TUBING LAPAROSCOPIC (IRRIGATION / IRRIGATOR) ×2 IMPLANT
SLEEVE ENDOPATH XCEL 5M (ENDOMECHANICALS) ×4 IMPLANT
SPECIMEN JAR SMALL (MISCELLANEOUS) ×2 IMPLANT
SPONGE GAUZE 2X2 STER 10/PKG (GAUZE/BANDAGES/DRESSINGS) ×1
STRIP CLOSURE SKIN 1/8X3 (GAUZE/BANDAGES/DRESSINGS) ×2 IMPLANT
SUT MNCRL AB 4-0 PS2 18 (SUTURE) ×2 IMPLANT
SUT VICRYL 0 UR6 27IN ABS (SUTURE) ×4 IMPLANT
TOWEL OR 17X24 6PK STRL BLUE (TOWEL DISPOSABLE) ×2 IMPLANT
TOWEL OR 17X26 10 PK STRL BLUE (TOWEL DISPOSABLE) ×2 IMPLANT
TRAY LAPAROSCOPIC (CUSTOM PROCEDURE TRAY) ×2 IMPLANT
TROCAR XCEL BLUNT TIP 100MML (ENDOMECHANICALS) ×2 IMPLANT
TROCAR XCEL NON-BLD 5MMX100MML (ENDOMECHANICALS) ×2 IMPLANT
TUBING INSUFFLATION (TUBING) ×2 IMPLANT

## 2013-12-01 NOTE — Anesthesia Postprocedure Evaluation (Signed)
  Anesthesia Post-op Note  Patient: Susan GambleMarlayna J Schneider  Procedure(s) Performed: Procedure(s): LAPAROSCOPIC CHOLECYSTECTOMY WITH INTRAOPERATIVE CHOLANGIOGRAM (N/A)  Patient Location: PACU  Anesthesia Type:General  Level of Consciousness: awake, alert , oriented and patient cooperative  Airway and Oxygen Therapy: Patient Spontanous Breathing  Post-op Pain: mild  Post-op Assessment: Post-op Vital signs reviewed, Patient's Cardiovascular Status Stable, Respiratory Function Stable, Patent Airway, No signs of Nausea or vomiting and Pain level controlled  Post-op Vital Signs: stable  Last Vitals:  Filed Vitals:   12/01/13 1256  BP:   Pulse:   Temp: 36.8 C  Resp:     Complications: No apparent anesthesia complications

## 2013-12-01 NOTE — Anesthesia Preprocedure Evaluation (Addendum)
Anesthesia Evaluation  Patient identified by MRN, date of birth, ID band Patient awake    Reviewed: Allergy & Precautions, H&P , NPO status , Patient's Chart, lab work & pertinent test results  Airway Mallampati: II TM Distance: >3 FB Neck ROM: Full    Dental  (+) Dental Advisory Given, Teeth Intact   Pulmonary          Cardiovascular     Neuro/Psych  Headaches,    GI/Hepatic   Endo/Other    Renal/GU      Musculoskeletal   Abdominal   Peds  Hematology   Anesthesia Other Findings   Reproductive/Obstetrics                         Anesthesia Physical Anesthesia Plan  ASA: I  Anesthesia Plan: General   Post-op Pain Management:    Induction: Intravenous  Airway Management Planned: Oral ETT  Additional Equipment:   Intra-op Plan:   Post-operative Plan: Extubation in OR  Informed Consent: I have reviewed the patients History and Physical, chart, labs and discussed the procedure including the risks, benefits and alternatives for the proposed anesthesia with the patient or authorized representative who has indicated his/her understanding and acceptance.     Plan Discussed with: CRNA, Anesthesiologist and Surgeon  Anesthesia Plan Comments:         Anesthesia Quick Evaluation

## 2013-12-01 NOTE — Discharge Instructions (Signed)
[facility] LAPAROSCOPIC SURGERY: POST OP INSTRUCTIONS Always review your discharge instruction sheet given to you by the facility where your surgery was performed. IF YOU HAVE DISABILITY OR FAMILY LEAVE FORMS, YOU MUST BRING THEM TO THE OFFICE FOR PROCESSING.   DO NOT GIVE THEM TO YOUR DOCTOR.  1. A prescription for pain medication may be given to you upon discharge.  Take your pain medication as prescribed, if needed.  If narcotic pain medicine is not needed, then you may take acetaminophen (Tylenol) or ibuprofen (Advil) as needed. 2. Take your usually prescribed medications unless otherwise directed. 3. If you need a refill on your pain medication, please contact your pharmacy.  They will contact our office to request authorization. Prescriptions will not be filled after 5pm or on week-ends. 4. You should follow a light diet the first few days after arrival home, such as soup and crackers, etc.  Be sure to include lots of fluids daily. 5. Most patients will experience some swelling and bruising in the area of the incisions.  Ice packs will help.  Swelling and bruising can take several days to resolve.  6. It is common to experience some constipation if taking pain medication after surgery.  Increasing fluid intake and taking a stool softener (such as Colace) will usually help or prevent this problem from occurring.  A mild laxative (Milk of Magnesia or Miralax) should be taken according to package instructions if there are no bowel movements after 48 hours. 7. Unless discharge instructions indicate otherwise, you may remove your bandages 48 hours after surgery, and you may shower at that time.  You  have steri-strips (small skin tapes) in place directly over the incision.  These strips should be left on the skin for 7-10 days.  . 8. ACTIVITIES:  You may resume regular (light) daily activities beginning the next day--such as daily self-care, walking, climbing stairs--gradually  increasing activities as tolerated.  You may have sexual intercourse when it is comfortable.  Refrain from any heavy lifting or straining until approved by your doctor. a. You may drive when you are no longer taking prescription pain medication, you can comfortably wear a seatbelt, and you can safely maneuver your car and apply brakes. 9. You should see your doctor in the office for a follow-up appointment approximately 2-3 weeks after your surgery.  Make sure that you call for this appointment within a day or two after you arrive home to insure a convenient appointment time. 10. OTHER INSTRUCTIONS:DO NOT LIFT, PUSH, OR PULL ANYTHING GREATER THAN 10 POUNDS FOR 2 WEEKS  WHEN TO CALL YOUR DOCTOR: 1. Fever over 101.0 2. Inability to urinate 3. Continued bleeding from incision. 4. Increased pain, redness, or drainage from the incision. 5. Increasing abdominal pain  The clinic staff is available to answer your questions during regular business hours.  Please dont hesitate to call and ask to speak to one of the nurses for clinical concerns.  If you have a medical emergency, go to the nearest emergency room or call 911.  A surgeon from [facility] is always on call at the hospital. [address], [address], [place], [place]  [id number] ? [address] [address], [place], [place]   [id number] [phone] ? [phone] ? FAX [phone] Web site: [email or web address]   What to eat:  For your first meals, you should eat lightly; only small meals initially.  If you do not have nausea, you may eat larger meals.  Avoid spicy,  greasy and heavy food.    General Anesthesia, Adult, Care After  Refer to this sheet in the next few weeks. These instructions provide you with information on caring for yourself after your procedure. Your health care provider may also give you more specific instructions. Your treatment has been planned according to current medical practices, but problems sometimes  occur. Call your health care provider if you have any problems or questions after your procedure.  WHAT TO EXPECT AFTER THE PROCEDURE  After the procedure, it is typical to experience:  Sleepiness.  Nausea and vomiting. HOME CARE INSTRUCTIONS  For the first 24 hours after general anesthesia:  Have a responsible person with you.  Do not drive a car. If you are alone, do not take public transportation.  Do not drink alcohol.  Do not take medicine that has not been prescribed by your health care provider.  Do not sign important papers or make important decisions.  You may resume a normal diet and activities as directed by your health care provider.  Change bandages (dressings) as directed.  If you have questions or problems that seem related to general anesthesia, call the hospital and ask for the anesthetist or anesthesiologist on call. SEEK MEDICAL CARE IF:  You have nausea and vomiting that continue the day after anesthesia.  You develop a rash. SEEK IMMEDIATE MEDICAL CARE IF:  You have difficulty breathing.  You have chest pain.  You have any allergic problems. Document Released: 05/14/2000 Document Revised: 10/08/2012 Document Reviewed: 08/21/2012  [facility] Patient Information 2014 [place].

## 2013-12-01 NOTE — H&P (Signed)
History of Present Illness Susan Schneider(Monie Shere M. Solace Manwarren MD; 10/22/2013 10:39 AM) The patient is a 23 year old female who presents for evaluation of gall stones. 23 year old [place] female referred by Dr. Linus Makoharles Lownes for evaluation of gallstones. She states that she has probably been having episodes on and off since January. However she had a severe episode around August 15 that made her go to the emergency room. She reports pain in her right upper abdomen that generally lasts for several hours. This episode was associated with nausea, vomiting, and diarrhea. It lasted several hours longer than prior episodes as well as it was more intense which prompted her to go to the emergency room. She denies any unpla correlatioic stools, jaundice. She has noticed a correlation with gre jaundice. She has noticed a correlation with greasy food. She is a Consulting civil engineerstudent at [employer]. in the emergency room she underwent an abdominal ultrasound which revealed gallstones measuring up to 2 cm. Her common bile duct is normal. Her labs were normal.  12/01/13 UPDATE - pt denies any changes since she was initially seen in my office  Other Problems Littie Deeds(Kendall [place], MA; 10/22/2013 9:32 AM) Cholelithiasis  Past Surgical History Littie Deeds(Kendall [place], MA; 10/22/2013 9:32 AM) No pertinent past surgical history  Diagnostic Studies History Littie Deeds(Kendall [place], [place]; 10/22/2013 9:32 AM) Colonoscopy never Mammogram never  Allergies Littie Deeds(Kendall War[place], MA; 10/22/2013 9:32 AM) No Known Drug Allergies09/04/2013  Medication History Littie Deeds(Kendall Warmath, MA; 10/22/2013 9:33 AM) Amethia (0.15-0.03 &0.01MG  Tablet, Oral) Active.  Social History Penni Bombard(Kendall [place], [place]; 10/22/2013 9:32 AM) Alcohol use Occasional alcohol use. No caffeine use No drug use Tobacco use Never smoker.  Family History Penni Bombard(Kendall [place], [place]; 10/22/2013 9:32 AM) Arthritis Family Members In General. Breast Cancer Family Members In General. Diabetes Mellitus Family  Members In General. Hypertension Father, Mother. Thyroid problems Father.  Pregnancy / Birth History Penni Bombard(Kendall [place], [place]; 10/22/2013 9:32 AM) Age at menarche 14 years. Contraceptive History Oral contraceptives. Gravida 0 Irregular periods Para 0  Review of Systems Penni Bombard(Kendall [place]; 10/22/2013 9:32 AM) General Present- Appetite Loss and Fatigue. Not Present- Chills, Fever, Night Sweats, Weight Gain and Weight Loss. Skin Not Present- Change in Wart/Mole, Dryness, Hives, Jaundice, New Lesions, Non-Healing Wounds, Rash and Ulcer. HEENT Present- Wears glasses/contact lenses. Not Present- Earache, Hearing Loss, Hoarseness, Nose Bleed, Oral Ulcers, Ringing in the Ears, Seasonal Allergies, Sinus Pain, Sore Throat, Visual Disturbances and Yellow Eyes. Respiratory Not Present- Bloody sputum, Chronic Cough, Difficulty Breathing, Snoring and Wheezing. Breast Not Present- Breast Mass, Breast Pain, Nipple Discharge and Skin Changes. Cardiovascular Not Present- Chest Pain, Difficulty Breathing Lying Down, Leg Cramps, Palpitations, Rapid Heart Rate, Shortness of Breath and Swelling of Extremities. Gastrointestinal Present- Abdominal Pain and Vomiting. Not Present- Bloating, Bloody Stool, Change in Bowel Habits, Chronic diarrhea, Constipation, Difficulty Swallowing, Excessive gas, Gets full quickly at meals, Hemorrhoids, Indigestion, Nausea and Rectal Pain. Female Genitourinary Not Present- Frequency, Nocturia, Painful Urination, Pelvic Pain and Urgency. Musculoskeletal Not Present- Back Pain, Joint Pain, Joint Stiffness, Muscle Pain, Muscle Weakness and Swelling of Extremities. Neurological Not Present- Decreased Memory, Fainting, Headaches, Numbness, Seizures, Tingling, Tremor, Trouble walking and Weakness. Psychiatric Not Present- Anxiety, Bipolar, Change in Sleep Pattern, Depression, Fearful and Frequent crying. Endocrine Not Present- Cold Intolerance, Excessive Hunger, Hair Changes, Heat  Intolerance, Hot flashes and New Diabetes. Hematology Not Present- Easy Bruising, Excessive bleeding, Gland problems, HIV and Persistent Infections.  BP 121/63  Pulse 70  Temp(Src) 98.2 F (36.8 C) (Oral)  Resp 18  Ht 5\' 6"  (  1.676 m)  Wt 220 lb (99.791 kg)  BMI 35.53 kg/m2  SpO2 99%  LMP 11/13/2013  Vitals (Kendall Warmath MA; 10/22/2013 9:32 AM) 10/22/2013 9:32 AM Weight: 214 lb Height: 66in Body Surface Area: 2.13 m Body Mass Index: 34.54 kg/m Temp.: 82F(Oral)  Pulse: 80 (Regular)  BP: 118/78 (Sitting, Left Arm, Standard)    Physical Exam Atilano Ina(Rheannon Cerney M Fedor Kazmierski, MD; 10/22/2013 10:40 00) General Mental Status-Alert. General Appearance-Consistent with stated age. Hydration-Well hydrated. Voice-Normal.  Head and Neck Head-normocephalic, atraumatic with no lesions or palpable masses. Trachea-midline.  Eye Eyeball - Bilateral-Extraocular movements intact. Sclera/Conjunctiva - Bilateral-No scleral icterus.  Chest and Lung Exam Chest and lung exam reveals -quiet, even and easy respiratory effort with no use of accessory muscles and on auscultation, normal breath sounds, no adventitious sounds and normal vocal resonance. Inspection Chest Wall - Normal. Back - normal.  Cardiovascular Auscultation Rhythm - Regular. Heart Sounds - Normal heart sounds. Murmurs & Other Heart Sounds - Normal exam - No Murmurs.  Abdomen Inspection Normal Exam - No Hernias. Skin - Scar - no surgical scars. Palpation/Percussion Normal exam - Soft, Non Tender, No Rebound tenderness, No Rigidity (guarding) and No hepatosplenomegaly. Auscultation Normal exam - Bowel sounds normal.  Peripheral Vascular Upper Extremity Inspection - Bilateral - Normal - No Clubbing, No Cyanosis, No Edema, Pulses Intact. Palpation - Pulses bilaterally normal. Lower Extremity Palpation - Pulses bilaterally normal.  Neurologic Mental  Status-Normal.  Neuropsychiatric Orientation-oriented X3. The patient's mood and affect are described as -normal. Judgment and Insight-insight is appropriate concerning matters relevant to self and the patient displays appropriate judgment regarding every day activities.  Musculoskeletal Normal Exam - Left-Upper Extremity Strength Normal and Lower Extremity Strength Normal. Normal Exam - Right-Upper Extremity Strength Normal and Lower Extremity Strength Normal.  Lymphatic Head & Neck General Head & Neck Lymphatics: Bilateral - Description - Normal.    Assessment & Plan Susan Schneider(Regginald Pask M. Kingstin Heims MD; 10/22/2013 10:45 AM) SYMPTOMATIC CHOLELITHIASIS (574.20  K80.20) Impression: I believe the patient's symptoms are consistent with gallbladder disease.  We discussed gallbladder disease. The patient was given Agricultural engineereducational material. We discussed non-operative and operative management. We discussed the signs & symptoms of acute cholecystitis  I discussed laparoscopic cholecystectomy with IOC in detail with the patient along with her father on speakerphone. The patient was given educational material as well as diagrams detailing the procedure. We discussed the risks and benefits of a laparoscopic cholecystectomy including, but not limited to bleeding, infection, injury to surrounding structures such as the intestine or liver, bile leak, retained gallstones, need to convert to an open procedure, prolonged diarrhea, blood clots such as DVT, common bile duct injury, anesthesia risks, and possible need for additional procedures. We discussed the typical post-operative recovery course. I explained that the likelihood of improvement of their symptoms is good.  She will be given access to watch the EMMI video on gallbladder surgery.  Our schedulers will contact her to schedule surgery in the next few days. Her [place] to stay with her around the time of surgery. Current  Plans  Instructions: Watch EMMI video on gallbladder surgery. Our office will contact you to schedule surgery in the next few days. Avoid foods that tend to trigger an attack. Please call with any questions or concerns Pt Education - Gallstones *: abdominal pain  OBESITY  Mary SellaEric M. Andrey CampanileWilson, MD, FACS General, Bariatric, & Minimally Invasive Surgery [facility], [place]

## 2013-12-01 NOTE — Op Note (Signed)
Susan GambleMarlayna J Schneider [id number] 1990/06/25 12/01/2013  Laparoscopic Cholecystectomy with IOC Procedure Note  Indications: This patient presents with symptomatic gallbladder disease and will undergo laparoscopic cholecystectomy.  Pre-operative Diagnosis: Calculus of gallbladder with other cholecystitis, without mention of obstruction  Post-operative Diagnosis: Same  Surgeon: Atilano InaWILSON,Zurie Platas M   Assistants: none  Anesthesia: General endotracheal anesthesia  ASA Class: 1  Procedure Details  The patient was seen again in the Holding Room. The risks, benefits, complications, treatment options, and expected outcomes were discussed with the patient. The possibilities of reaction to medication, pulmonary aspiration, perforation of viscus, bleeding, recurrent infection, finding a normal gallbladder, the need for additional procedures, failure to diagnose a condition, the possible need to convert to an open procedure, and creating a complication requiring transfusion or operation were discussed with the patient. The likelihood of improving the patient's symptoms with return to their baseline status is good.  The patient and/or family concurred with the proposed plan, giving informed consent. The site of surgery properly noted. The patient was taken to Operating Room, identified as Susan Schneider and the procedure verified as Laparoscopic Cholecystectomy with Intraoperative Cholangiogram. A Time Out was held and the above information confirmed. Antibiotic prophylaxis was administered.   Prior to the induction of general anesthesia, antibiotic prophylaxis was administered. General endotracheal anesthesia was then administered and tolerated well. After the induction, the abdomen was prepped with Chloraprep and draped in the sterile fashion. The patient was positioned in the supine position.  Local anesthetic agent was injected into the skin near the umbilicus and an incision made. We dissected down to the  abdominal fascia with blunt dissection.  The fascia was incised vertically and we entered the peritoneal cavity bluntly.  A pursestring suture of 0-Vicryl was placed around the fascial opening.  The Hasson cannula was inserted and secured with the stay suture.On initial connection of the CO2, it was evident I was still in preperitoneal space. She had a tough layer of peritoneum and it was difficult to see so I decided to gain entry to her abdomen using Optiview technique in LUQ. A small incision was made in the subxiphoid position, then using a 5mm 0 degree laparoscope thru a  5mm trocar I advanced the camera thru all layers of the abdominal wall and entered the abdominal cavity. Pneumoperitoneum was then created with CO2 and tolerated well without any adverse changes in the patient's vital signs. I inspected the umbilical trocar site and confirmed that I had not penetrated the peritoneum. I was able to advance the Hasson and enter the abdomen.  Two 5-mm ports were placed in the right upper quadrant. All skin incisions were infiltrated with a local anesthetic agent before making the incision and placing the trocars.   We positioned the patient in reverse Trendelenburg, tilted slightly to the patient's left.  The gallbladder was identified, the fundus grasped and retracted cephalad. Adhesions were lysed bluntly and with the electrocautery where indicated, taking care not to injure any adjacent organs or viscus. The infundibulum was grasped and retracted laterally, exposing the peritoneum overlying the triangle of Calot. This was then divided and exposed in a blunt fashion. A critical view of the cystic duct and cystic artery was obtained.  The cystic duct was clearly identified and bluntly dissected circumferentially. The cystic duct was ligated with a clip distally.   An incision was made in the cystic duct and the [facility] chol[facility] catheter introduced. The catheter was secured using a clip. A cholangiogram was  then obtained  which showed good visualization of the distal and proximal biliary tree with no sign of filling defects or obstruction.  Contrast flowed easily into the duodenum. The catheter was then removed.   The cystic duct was then ligated with clips and divided. The cystic artery which had been identified and dissected free was ligated with clips and divided as well.   The gallbladder was dissected from the liver bed in retrograde fashion with the electrocautery. The gallbladder was removed and placed in an Endocatch sac.  The gallbladder and Endocatch sac were then removed through the umbilical port site. The liver bed was irrigated and inspected. Hemostasis was achieved with the electrocautery.  The pursestring suture was used to close the umbilical fascia.  An additional interrupted 0 vicryl suture as placed at the umbilical fascia site.   We again inspected the right upper quadrant for hemostasis.  The umbilical closure was inspected and there was no air leak and nothing trapped within the closure. Pneumoperitoneum was released as we removed the trocars.  4-0 Monocryl was used to close the skin.   Benzoin, steri-strips, and clean dressings were applied. The patient was then extubated and brought to the recovery room in stable condition. Instrument, sponge, and needle counts were correct at closure and at the conclusion of the case.   Findings: Chronic Cholecystitis with Cholelithiasis  Estimated Blood Loss: Minimal         Drains: none         Specimens: Gallbladder           Complications: None; patient tolerated the procedure well.         Disposition: PACU - hemodynamically stable.         Condition: stable  Mary SellaEric M. Andrey CampanileWilson, MD, FACS General, Bariatric, & Minimally Invasive Surgery [facility], [place]

## 2013-12-01 NOTE — Transfer of Care (Signed)
Immediate Anesthesia Transfer of Care Note  Patient: Susan Schneider  Procedure(s) Performed: Procedure(s): LAPAROSCOPIC CHOLECYSTECTOMY WITH INTRAOPERATIVE CHOLANGIOGRAM (N/A)  Patient Location: PACU  Anesthesia Type:General  Level of Consciousness: awake, alert  and oriented  Airway & Oxygen Therapy: Patient Spontanous Breathing and Patient connected to nasal cannula oxygen  Post-op Assessment: Report given to PACU RN and Post -op Vital signs reviewed and stable  Post vital signs: Reviewed and stable  Complications: No apparent anesthesia complications

## 2013-12-02 ENCOUNTER — Encounter (HOSPITAL_COMMUNITY): Payer: Self-pay | Admitting: General Surgery

## 2014-01-07 ENCOUNTER — Telehealth (INDEPENDENT_AMBULATORY_CARE_PROVIDER_SITE_OTHER): Payer: Self-pay

## 2014-01-07 DIAGNOSIS — Z9049 Acquired absence of other specified parts of digestive tract: Secondary | ICD-10-CM

## 2014-01-07 DIAGNOSIS — R112 Nausea with vomiting, unspecified: Secondary | ICD-10-CM

## 2014-01-07 NOTE — Telephone Encounter (Signed)
Pt seen in office by Dr Andrey CampanileWilson and labs ordered in epic.

## 2014-06-28 ENCOUNTER — Encounter (HOSPITAL_COMMUNITY): Payer: Self-pay | Admitting: Emergency Medicine

## 2014-06-28 ENCOUNTER — Emergency Department (INDEPENDENT_AMBULATORY_CARE_PROVIDER_SITE_OTHER)
Admission: EM | Admit: 2014-06-28 | Discharge: 2014-06-28 | Disposition: A | Discharge status: Home / Self Care | Payer: 59 | Source: Home / Self Care | Attending: Family Medicine | Admitting: Family Medicine

## 2014-06-28 DIAGNOSIS — A084 Viral intestinal infection, unspecified: Secondary | ICD-10-CM

## 2014-06-28 LAB — POCT PREGNANCY, URINE: Preg Test, Ur: NEGATIVE

## 2014-06-28 MED ORDER — ONDANSETRON HCL 4 MG/2ML IJ SOLN
INTRAMUSCULAR | Status: AC
  Filled 2014-06-28: qty 2

## 2014-06-28 MED ORDER — PROMETHAZINE HCL 25 MG PO TABS: 25.0000 mg | ORAL_TABLET | Freq: Four times a day (QID) | ORAL | Status: DC | PRN

## 2014-06-28 MED ORDER — SODIUM CHLORIDE 0.9 % IV BOLUS (SEPSIS)
1000.0000 mL | Freq: Once | INTRAVENOUS | Status: AC
  Administered 2014-06-28: 1000 mL via INTRAVENOUS

## 2014-06-28 MED ORDER — ONDANSETRON HCL 4 MG/2ML IJ SOLN
4.0000 mg | Freq: Once | INTRAMUSCULAR | Status: AC
  Administered 2014-06-28: 4 mg via INTRAVENOUS

## 2014-06-28 NOTE — Discharge Instructions (Signed)
Thank you for coming in today. If your belly pain worsens, or you have high fever, bad vomiting, blood in your stool or black tarry stool go to the Emergency Room.   Viral Gastroenteritis Viral gastroenteritis is also known as stomach flu. This condition affects the stomach and intestinal tract. It can cause sudden diarrhea and vomiting. The illness typically lasts 3 to 8 days. Most people develop an immune response that eventually gets rid of the virus. While this natural response develops, the virus can make you quite ill. CAUSES  Many different viruses can cause gastroenteritis, such as rotavirus or noroviruses. You can catch one of these viruses by consuming contaminated food or water. You may also catch a virus by sharing utensils or other personal items with an infected person or by touching a contaminated surface. SYMPTOMS  The most common symptoms are diarrhea and vomiting. These problems can cause a severe loss of body fluids (dehydration) and a body salt (electrolyte) imbalance. Other symptoms may include:  Fever.  Headache.  Fatigue.  Abdominal pain. DIAGNOSIS  Your caregiver can usually diagnose viral gastroenteritis based on your symptoms and a physical exam. A stool sample may also be taken to test for the presence of viruses or other infections. TREATMENT  This illness typically goes away on its own. Treatments are aimed at rehydration. The most serious cases of viral gastroenteritis involve vomiting so severely that you are not able to keep fluids down. In these cases, fluids must be given through an intravenous line (IV). HOME CARE INSTRUCTIONS   Drink enough fluids to keep your urine clear or pale yellow. Drink small amounts of fluids frequently and increase the amounts as tolerated.  Ask your caregiver for specific rehydration instructions.  Avoid:  Foods high in sugar.  Alcohol.  Carbonated drinks.  Tobacco.  Juice.  Caffeine drinks.  Extremely hot or cold  fluids.  Fatty, greasy foods.  Too much intake of anything at one time.  Dairy products until 24 to 48 hours after diarrhea stops.  You may consume probiotics. Probiotics are active cultures of beneficial bacteria. They may lessen the amount and number of diarrheal stools in adults. Probiotics can be found in yogurt with active cultures and in supplements.  Wash your hands well to avoid spreading the virus.  Only take over-the-counter or prescription medicines for pain, discomfort, or fever as directed by your caregiver. Do not give aspirin to children. Antidiarrheal medicines are not recommended.  Ask your caregiver if you should continue to take your regular prescribed and over-the-counter medicines.  Keep all follow-up appointments as directed by your caregiver. SEEK IMMEDIATE MEDICAL CARE IF:   You are unable to keep fluids down.  You do not urinate at least once every 6 to 8 hours.  You develop shortness of breath.  You notice blood in your stool or vomit. This may look like coffee grounds.  You have abdominal pain that increases or is concentrated in one small area (localized).  You have persistent vomiting or diarrhea.  You have a fever.  The patient is a child younger than 3 months, and he or she has a fever.  The patient is a child older than 3 months, and he or she has a fever and persistent symptoms.  The patient is a child older than 3 months, and he or she has a fever and symptoms suddenly get worse.  The patient is a baby, and he or she has no tears when crying. MAKE SURE YOU:     Understand these instructions.  Will watch your condition.  Will get help right away if you are not doing well or get worse. Document Released: 02/05/2005 Document Revised: 04/30/2011 Document Reviewed: 11/22/2010 [employer] Patient Information 2015 [employer]. This information is not intended to replace advice given to you by your health care provider. Make sure you discuss  any questions you have with your health care provider.  

## 2014-06-28 NOTE — ED Provider Notes (Signed)
Susan Schneider is a 24 y.o. female who presents to Urgent Care today for vomiting and diarrhea. Patient was in her normal state of health when she developed vomiting and diarrhea yesterday. She did not drink an excessive amount of alcohol. No fevers or chills. He denies any abdominal pain. She has a history of left upper cholecystectomy performed about 6 months ago. She feels a little lightheaded and dizzy especially when she stands.    Past Medical History  Diagnosis Date  . Headache     sinus   Past Surgical History  Procedure Laterality Date  . Cholecystectomy N/A 12/01/2013    Procedure: LAPAROSCOPIC CHOLECYSTECTOMY WITH INTRAOPERATIVE CHOLANGIOGRAM;  Surgeon: Atilano InaEric M Wilson, MD;  [facility];  Service: General;  Laterality: N/A;   History  Substance Use Topics  . Smoking status: Never Smoker   . Smokeless tobacco: Never Used  . Alcohol Use: No   ROS as above Medications: No current facility-administered medications for this encounter.   Current Outpatient Prescriptions  Medication Sig Dispense Refill  . Levonorgestrel-Ethinyl Estradiol (AMETHIA) 0.15-0.03 &0.01 MG tablet Take 1 tablet by mouth daily.    . promethazine (PHENERGAN) 25 MG tablet Take 1 tablet (25 mg total) by mouth every 6 (six) hours as needed for nausea or vomiting. 30 tablet 0  . triamcinolone cream (KENALOG) 0.1 % Apply 1 application topically 4 (four) times daily as needed (rash).      No Known Allergies   Exam:  BP 126/82 mmHg  Pulse 78  Temp(Src) 98.2 F (36.8 C) (Oral)  Resp 16  SpO2 97% Gen: Well NAD HEENT: EOMI,  MMM Lungs: Normal work of breathing. CTABL Heart: RRR no MRG Abd: NABS, Soft. Nondistended, Nontender Exts: Brisk capillary refill, warm and well perfused.   Patient was given a 1 L normal saline bolus and 4 mg IV Zofran and felt much better  Results for orders placed or performed during the hospital encounter of 06/28/14 (from the past 24 hour(s))  Pregnancy, urine POC      Status: None   Collection Time: 06/28/14 12:50 PM  Result Value Ref Range   Preg Test, Ur NEGATIVE NEGATIVE   No results found.  Assessment and Plan: 24 y.o. female with viral gastroenteritis. Discharge with Phenergan and watchful waiting. Return as needed.  Discussed warning signs or symptoms. Please see discharge instructions. Patient expresses understanding.     Rodolph BongEvan S Deshae Dickison, MD 06/28/14 [phone]

## 2014-06-28 NOTE — ED Notes (Signed)
C/o vomiting States she was drinking last night  States she does have diarrhea and chills Denies any abd pain No tx tried

## 2014-11-30 IMAGING — RF DG CHOLANGIOGRAM OPERATIVE
1 series · 4 of 4 positions shown · non-contrast
Comparison: 10/03/2013

CLINICAL DATA: Cholelithiasis.

EXAM:
INTRAOPERATIVE CHOLANGIOGRAM
TECHNIQUE: Cholangiographic images from the C-arm fluoroscopic device were
submitted for interpretation post-operatively. Please see the
procedural report for the amount of contrast and the fluoroscopy
time utilized.

[Series 1: run · 4 of 36 frames shown]
[frame 6/36]
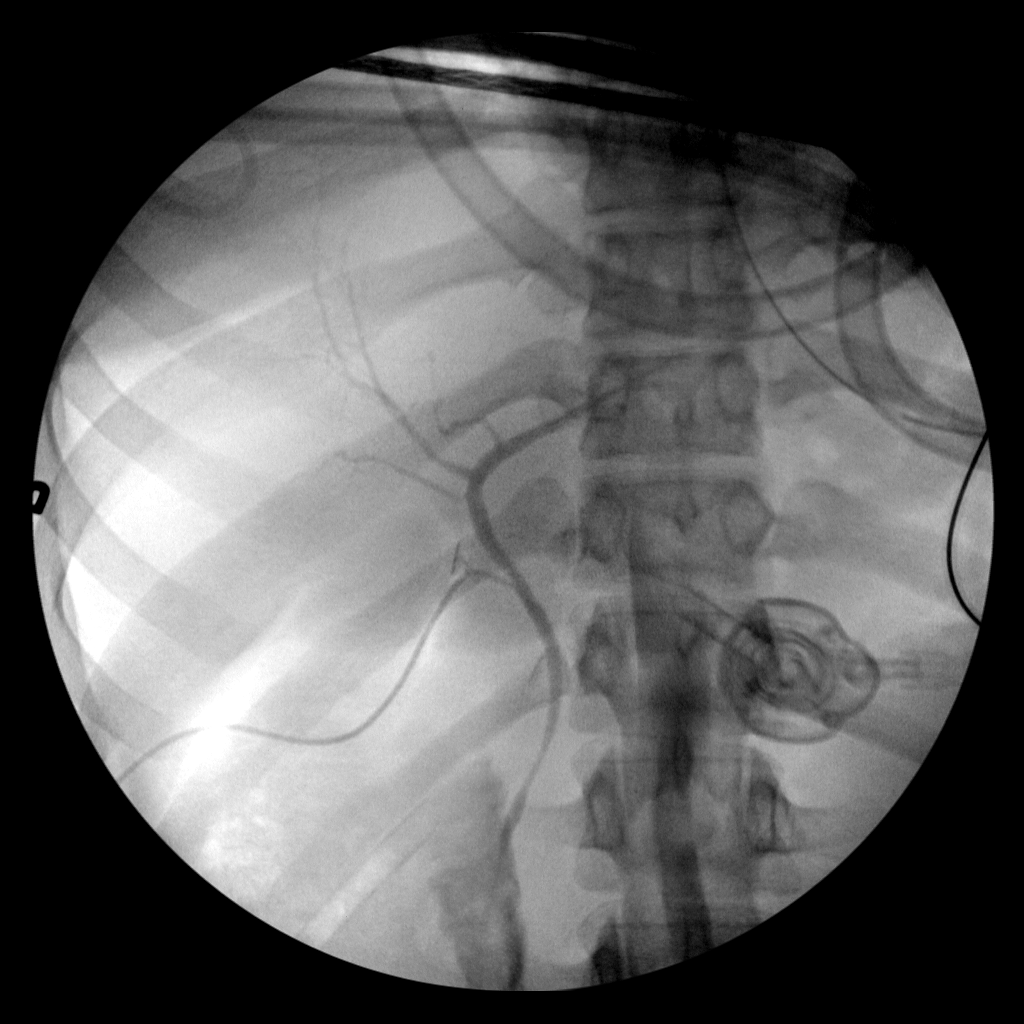
[frame 19/36]
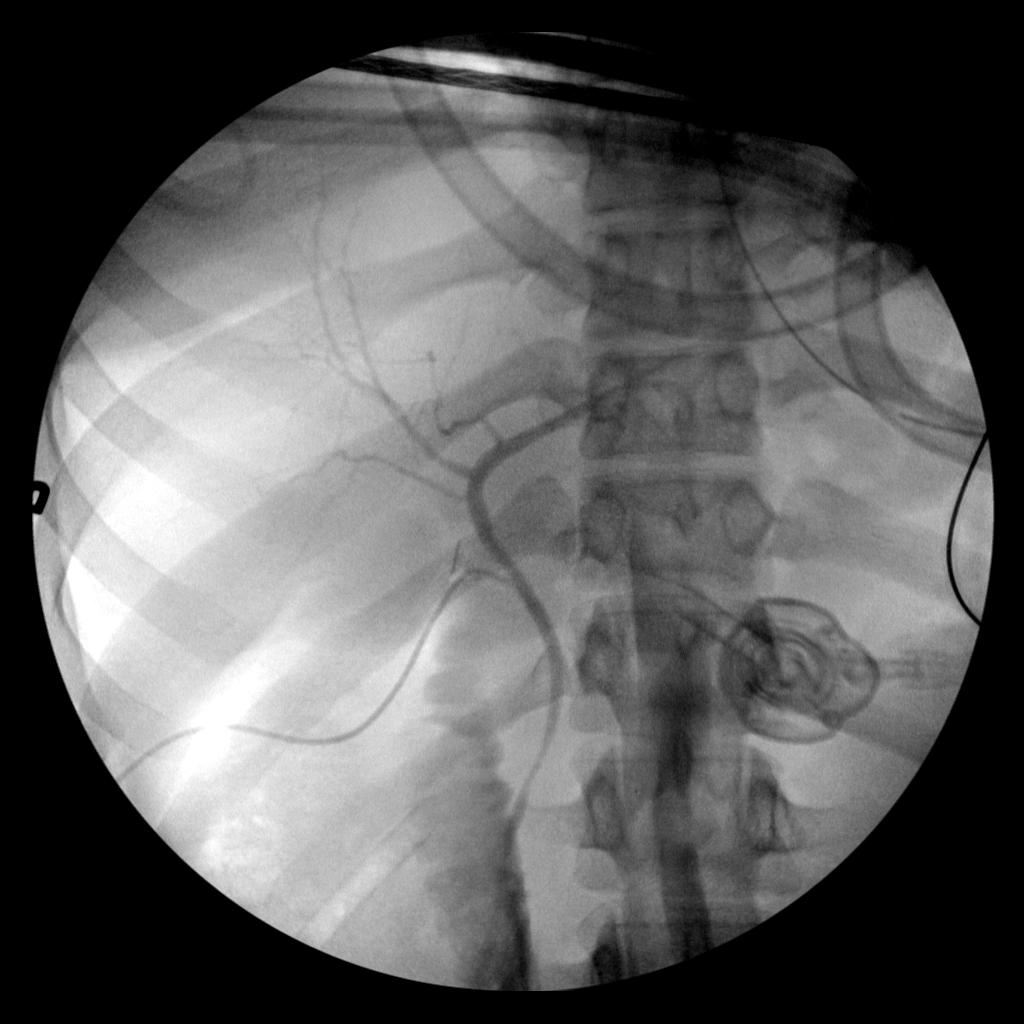
[frame 27/36]
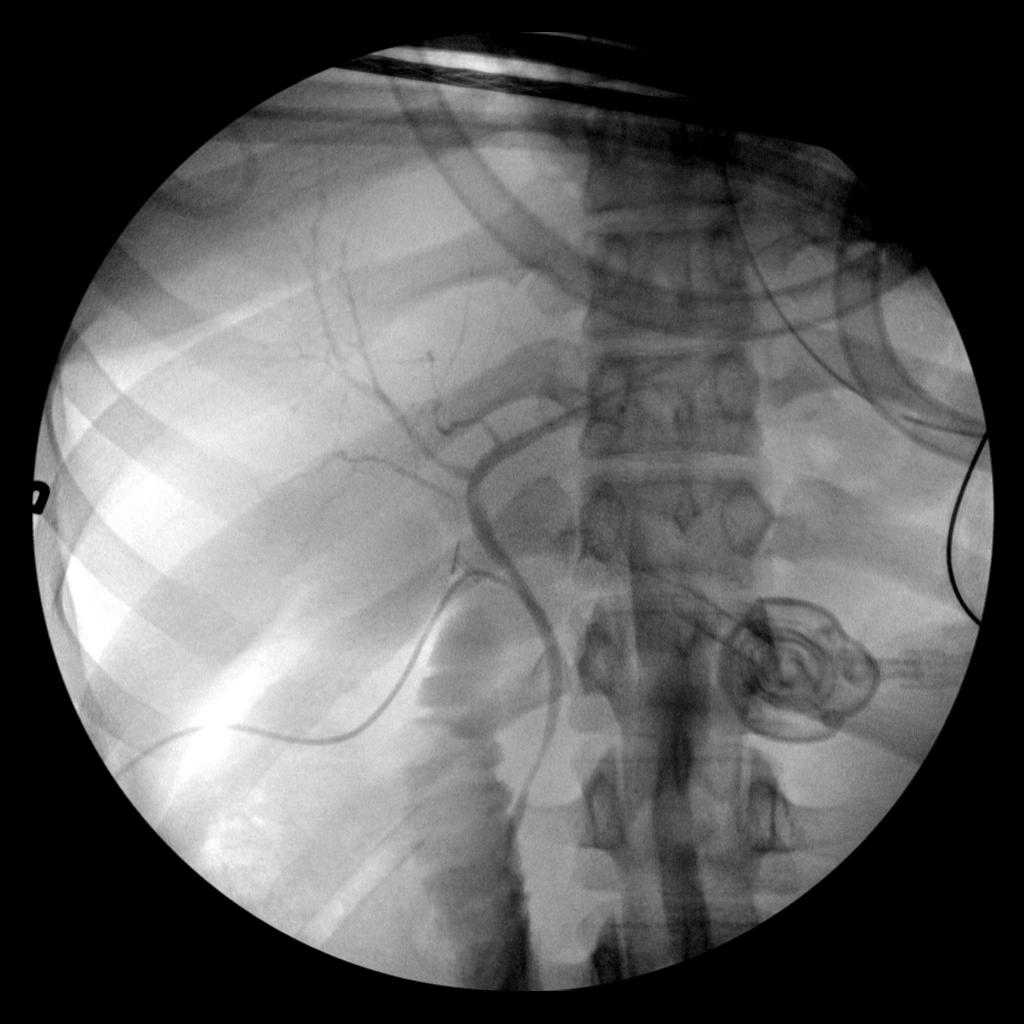
[frame 31/36]
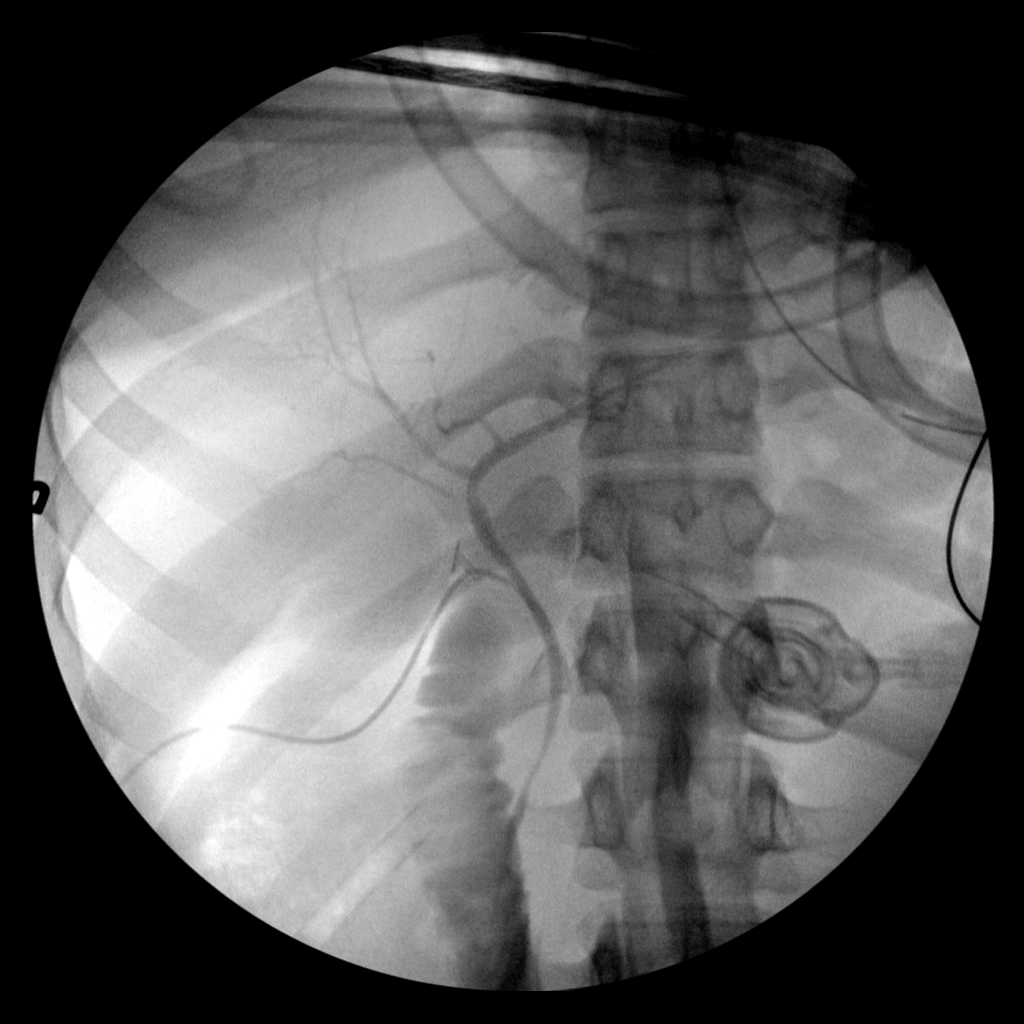

[4 of 4 positions shown; findings below may reference images not displayed]

FINDINGS: The intrahepatic and extrahepatic biliary system is non dilated. No
obstructing lesions or stones. Contrast drains into the duodenum.
IMPRESSION: Normal intraoperative cholangiogram.  No obstructing stones.

## 2015-03-09 ENCOUNTER — Emergency Department (HOSPITAL_COMMUNITY)
Admission: EM | Admit: 2015-03-09 | Discharge: 2015-03-09 | Disposition: A | Discharge status: Home / Self Care | Payer: 59 | Attending: Emergency Medicine | Admitting: Emergency Medicine

## 2015-03-09 ENCOUNTER — Encounter (HOSPITAL_COMMUNITY): Payer: Self-pay | Admitting: Emergency Medicine

## 2015-03-09 DIAGNOSIS — L732 Hidradenitis suppurativa: Secondary | ICD-10-CM | POA: Insufficient documentation

## 2015-03-09 DIAGNOSIS — M79621 Pain in right upper arm: Secondary | ICD-10-CM | POA: Diagnosis present

## 2015-03-09 DIAGNOSIS — Z793 Long term (current) use of hormonal contraceptives: Secondary | ICD-10-CM | POA: Insufficient documentation

## 2015-03-09 MED ORDER — SULFAMETHOXAZOLE-TRIMETHOPRIM 800-160 MG PO TABS: 1.0000 | ORAL_TABLET | Freq: Two times a day (BID) | ORAL | Status: AC

## 2015-03-09 NOTE — ED Provider Notes (Signed)
CSN: [id number]     Arrival date & time 03/09/15  1507 History  By signing my name below, I, Essence Howell, attest that this documentation has been prepared under the direction and in the presence of Alveta Heimlich, PA-C Electronically Signed: Charline Bills, ED Scribe 03/09/2015 at 3:45 PM.   Chief Complaint  Patient presents with  . Abscess   The history is provided by the patient. No language interpreter was used.   HPI Comments: Susan Schneider is a 25 y.o. female who presents to the Emergency Department complaining of a gradually worsening, painful area of swelling under her right axilla first noticed 2 days ago. Pain is exacerbated with palpation and movement of her arm. Pt has not noticed erythema or drainage from the area. She has tried warm compresses and Neosporin with mild relief. She denies fever, nausea, vomiting, myalgias, syncope, dizziness or any other systemic complaints. Pt reports h/o previous axillary abscesses which have not required I&D. States that she typically gets abscesses every month. She does not currently have a local PCP since she is in the process of moving from [place]. [place]. She has never seen a Careers adviser for this problem.    Past Medical History  Diagnosis Date  . Headache     sinus   Past Surgical History  Procedure Laterality Date  . Cholecystectomy N/A 12/01/2013    Procedure: LAPAROSCOPIC CHOLECYSTECTOMY WITH INTRAOPERATIVE CHOLANGIOGRAM;  Surgeon: Atilano Ina, MD;  Location: [facility] OR;  Service: General;  Laterality: N/A;   No family history on file. Social History  Substance Use Topics  . Smoking status: Never Smoker   . Smokeless tobacco: Never Used  . Alcohol Use: No   OB History    No data available     Review of Systems  Constitutional: Negative for fever.  Gastrointestinal: Negative for nausea and vomiting.  Musculoskeletal: Negative for myalgias.  Skin:       + Abscess  Neurological: Negative for syncope.  All other systems reviewed and  are negative.  Allergies  Review of patient's allergies indicates no known allergies.  Home Medications   Prior to Admission medications   Medication Sig Start Date End Date Taking? Authorizing Provider  Levonorgestrel-Ethinyl Estradiol (AMETHIA) 0.15-0.03 &0.01 MG tablet Take 1 tablet by mouth daily.    Historical Provider, MD  promethazine (PHENERGAN) 25 MG tablet Take 1 tablet (25 mg total) by mouth every 6 (six) hours as needed for nausea or vomiting. 06/28/14   Rodolph Bong, MD  sulfamethoxazole-trimethoprim (BACTRIM DS,SEPTRA DS) 800-160 MG tablet Take 1 tablet by mouth 2 (two) times daily. 03/09/15 03/16/15  Rolm Gala Samba Cumba, PA-C  triamcinolone cream (KENALOG) 0.1 % Apply 1 application topically 4 (four) times daily as needed (rash).  09/15/13   Historical Provider, MD   BP 127/63 mmHg  Pulse 110  Temp(Src) 98.2 F (36.8 C) (Oral)  Resp 16  Ht  (1.676 m)  Wt 200 lb (90.719 kg)  BMI 32.30 kg/m2  SpO2 100% Physical Exam  Constitutional: She appears well-developed and well-nourished. No distress.  HENT:  Head: Normocephalic and atraumatic.  Right Ear: External ear normal.  Left Ear: External ear normal.  Eyes: Conjunctivae are normal. Right eye exhibits no discharge. Left eye exhibits no discharge. No scleral icterus.  Neck: Normal range of motion.  Cardiovascular: Normal rate and intact distal pulses.   Pulmonary/Chest: Effort normal.  Musculoskeletal: Normal range of motion.  Moves all extremities spontaneously  Neurological: She is alert. Coordination normal.  Skin:  Skin is warm and dry.  Area of induration with draining sinus tract in the right axilla. Small amount of purulent drainage noted. Moderate amount of drainage expressed with light palpation. No overlying erythema. No abscess under left axilla. Multiple areas of scar tissues from previously healed abscesses noted to axilla bilaterally  Psychiatric: She has a normal mood and affect. Her behavior is normal.   Nursing note and vitals reviewed.  ED Course  Procedures (including critical care time) DIAGNOSTIC STUDIES: Oxygen Saturation is 100% on RA, normal by my interpretation.    COORDINATION OF CARE: 3:33 PM-Discussed treatment plan which includes Bactrim DS with pt at bedside and pt agreed to plan.   Labs Review Labs Reviewed - No data to display  Imaging Review No results found.   EKG Interpretation None      MDM   Final diagnoses:  Hidradenitis   Patient presenting with abscess of the axilla. Abscess is draining spontaneously and does not require I&D. Encouraged warm soaks at home and keeping the wound clean and dry. Will d/c with course of bactrim due to recurrent abscesses. Given referral information for [facility]. Patient expresses understanding and is stable for discharge.    I personally performed the services described in this documentation, which was scribed in my presence. The recorded information has been reviewed and is accurate.    Rolm Gala Trashawn Oquendo, PA-C 03/09/15 1709  Blane Ohara, MD 03/13/15 [phone]

## 2015-03-09 NOTE — ED Notes (Signed)
Pt c/o abscess under right arm x's 2 days.  Area has been draining

## 2015-03-09 NOTE — Discharge Instructions (Signed)

## 2015-03-09 NOTE — ED Notes (Signed)
I&D Tray setup at bedside.

## 2015-03-11 ENCOUNTER — Encounter (HOSPITAL_COMMUNITY): Payer: Self-pay | Admitting: Emergency Medicine

## 2015-03-11 ENCOUNTER — Emergency Department (HOSPITAL_COMMUNITY)
Admission: EM | Admit: 2015-03-11 | Discharge: 2015-03-11 | Disposition: A | Discharge status: Home / Self Care | Payer: 59 | Attending: Emergency Medicine | Admitting: Emergency Medicine

## 2015-03-11 DIAGNOSIS — L02411 Cutaneous abscess of right axilla: Secondary | ICD-10-CM | POA: Insufficient documentation

## 2015-03-11 DIAGNOSIS — L732 Hidradenitis suppurativa: Secondary | ICD-10-CM | POA: Insufficient documentation

## 2015-03-11 DIAGNOSIS — Z3202 Encounter for pregnancy test, result negative: Secondary | ICD-10-CM | POA: Insufficient documentation

## 2015-03-11 DIAGNOSIS — L0291 Cutaneous abscess, unspecified: Secondary | ICD-10-CM

## 2015-03-11 DIAGNOSIS — Z79899 Other long term (current) drug therapy: Secondary | ICD-10-CM | POA: Insufficient documentation

## 2015-03-11 LAB — I-STAT BETA HCG BLOOD, ED (MC, WL, AP ONLY): I-stat hCG, quantitative: <5 m[IU]/mL (ref <5)

## 2015-03-11 MED ORDER — LIDOCAINE-EPINEPHRINE (PF) 2 %-1:200000 IJ SOLN
10.0000 mL | Freq: Once | INTRAMUSCULAR | Status: AC
  Administered 2015-03-11: 10 mL via INTRADERMAL
  Filled 2015-03-11: qty 10

## 2015-03-11 MED ORDER — IBUPROFEN 800 MG PO TABS: 800.0000 mg | ORAL_TABLET | Freq: Three times a day (TID) | ORAL | Status: DC

## 2015-03-11 MED ORDER — CLINDAMYCIN HCL 150 MG PO CAPS: 300.0000 mg | ORAL_CAPSULE | Freq: Two times a day (BID) | ORAL | Status: DC

## 2015-03-11 MED ORDER — CLINDAMYCIN PHOSPHATE 1 % EX GEL: Freq: Two times a day (BID) | CUTANEOUS | Status: DC

## 2015-03-11 NOTE — Discharge Instructions (Signed)
Hidradenitis Suppurativa  Hidradenitis suppurativa is a long-term (chronic) skin disease that starts with blocked sweat glands or hair follicles. Bacteria may grow in these blocked openings of your skin. Hidradenitis suppurativa is like a severe form of acne that develops in areas of your body where acne would be unusual. It is most likely to affect the areas of your body where skin rubs against skin and becomes moist. This includes your:  Underarms.  Groin.  Genital areas.  Buttocks.  Upper thighs.  Breasts. Hidradenitis suppurativa may start out with small pimples. The pimples can develop into deep sores that break open (rupture) and drain pus. Over time your skin may thicken and become scarred. Hidradenitis suppurativa cannot be passed from person to person.  CAUSES  The exact cause of hidradenitis suppurativa is not known. This condition may be due to:  Female and female hormones. The condition is rare before and after puberty.  An overactive body defense system (immune system). Your immune system may overreact to the blocked hair follicles or sweat glands and cause swelling and pus-filled sores. RISK FACTORS  You may have a higher risk of hidradenitis suppurativa if you:  Are a woman.  Are between ages 56 and 13.  Have a family history of hidradenitis suppurativa.  Have a personal history of acne.  Are overweight.  Smoke.  Take the drug lithium. SIGNS AND SYMPTOMS  The first signs of an outbreak are usually painful skin bumps that look like pimples. As the condition progresses:  Skin bumps may get bigger and grow deeper into the skin.  Bumps under the skin may rupture and drain smelly pus.  Skin may become itchy and infected.  Skin may thicken and scar.  Drainage may continue through tunnels under the skin (fistulas).  Walking and moving your arms can become painful. DIAGNOSIS  Your health care provider may diagnose hidradenitis suppurativa based on your medical history and your  signs and symptoms. A physical exam will also be done. You may need to see a health care provider who specializes in skin diseases (dermatologist). You may also have tests done to confirm the diagnosis. These can include:  Swabbing a sample of pus or drainage from your skin so it can be sent to the lab and tested for infection.  Blood tests to check for infection. TREATMENT  The same treatment will not work for everybody with hidradenitis suppurativa. Your treatment will depend on how severe your symptoms are. You may need to try several treatments to find what works best for you. Part of your treatment may include cleaning and bandaging (dressing) your wounds. You may also have to take medicines, such as the following:  Antibiotics.  Acne medicines.  Medicines to block or suppress the immune system.  A diabetes medicine (metformin) is sometimes used to treat this condition.  For women, birth control pills can sometimes help relieve symptoms. You may need surgery if you have a severe case of hidradenitis suppurativa that does not respond to medicine. Surgery may involve:  Using a laser to clear the skin and remove hair follicles.  Opening and draining deep sores.  Removing the areas of skin that are diseased and scarred. HOME CARE INSTRUCTIONS  Learn as much as you can about your disease, and work closely with your health care providers.  Take medicines only as directed by your health care provider.  If you were prescribed an antibiotic medicine, finish it all even if you start to feel better.  If you  are overweight, losing weight may be very helpful. Try to reach and maintain a healthy weight.  Do not use any tobacco products, including cigarettes, chewing tobacco, or electronic cigarettes. If you need help quitting, ask your health care provider.  Do not shave the areas where you get hidradenitis suppurativa.  Do not wear deodorant.  Wear loose-fitting clothes.  Try not to overheat and get  sweaty.  Take a daily bleach bath as directed by your health care provider.  Fill your bathtub halfway with water.  Pour in  cup of unscented household bleach.  Soak for 5-10 minutes. Cover sore areas with a warm, clean washcloth (compress) for 5-10 minutes. SEEK MEDICAL CARE IF:  You have a flare-up of hidradenitis suppurativa.  You have chills or a fever.  You are having trouble controlling your symptoms at home. This information is not intended to replace advice given to you by your health care provider. Make sure you discuss any questions you have with your health care provider.  Document Released: 09/20/2003 Document Revised: 02/26/2014 Document Reviewed: 05/08/2013  Elsevier Interactive Patient Education 2016 [employer].    Emergency Department Resource Guide 1) Find a Doctor and Pay Out of Pocket Although you won't have to find out who is covered by your insurance plan, it is a good idea to ask around and get recommendations. You will then need to call the office and see if the doctor you have chosen will accept you as a new patient and what types of options they offer for patients who are self-pay. Some doctors offer discounts or will set up payment plans for their patients who do not have insurance, but you will need to ask so you aren't surprised when you get to your appointment.  2) Contact Your Local Health Department Not all health departments have doctors that can see patients for sick visits, but many do, so it is worth a call to see if yours does. If you don't know where your local health department is, you can check in your phone book. The CDC also has a tool to help you locate your state's health department, and many state websites also have listings of all of their local health departments.  3) Find a Walk-in Clinic If your illness is not likely to be very severe or complicated, you may want to try a walk in clinic. These are popping up all over the country in  pharmacies, drugstores, and shopping centers. They're usually staffed by nurse practitioners or physician assistants that have been trained to treat common illnesses and complaints. They're usually fairly quick and inexpensive. However, if you have serious medical issues or chronic medical problems, these are probably not your best option.  No Primary Care Doctor: - Call Health Connect at  [phone] - they can help you locate a primary care doctor that  accepts your insurance, provides certain services, etc. - Physician Referral Service- [phone]  Chronic Pain Problems: Organization         Address  Phone   Notes  [facility]  [phone] Patients need to be referred by their primary care doctor.   Medication Assistance: Organization         Address  Phone   Notes  [facility] ([place]) Medication [facility] [address] [place], [place] [address] [phone] --Must be a resident of [facility] -- Must have NO insurance coverage whatsoever (no Medicaid/ Medicare, etc.) -- The pt. MUST have a primary  care doctor that directs their care regularly and follows them in the community   [facility]ist  [phone]   [employer]  [phone]    Agencies that provide inexpensive medical care: Organization         Address  Phone   Notes  [facility]  [phone]   [facility]    [phone]   [facility] [address] [place], [place] [address] [phone]   [facility] [address], [place] [phone]   Planned Parenthood    [phone]   [facility]    [phone]   Community Health and [facility]  [address], [place] Phone:  [phone], Fax:  [phone] Hours of Operation:  9 am - 6 pm, M-F.  Also accepts Medicaid/Medicare and self-pay.  [facility]  [address],  [place] Phone: [phone], Fax: [phone]. Hours of Operation:  8:30 am - 5:30 pm, M-F.  Also accepts Medicaid and self-pay.  [facility] [place] [address], [place] Phone: [phone]   [facility] [address] Natasha Bence [place], [place] [phone], Ext. [phone] Mondays & Thursdays: 7-9 AM.  First 15 patients are seen on a first come, first serve basis.    Medicaid-accepting [facility] Providers:  Organization         Address  Phone   Notes  [facility] [address],  [phone] Also accepts self-pay patients.  [facility] [address] Laurell Josephs [place], [place]  [phone]   [facility] [address], [place] [phone]   [facility] [address] [place] [phone]   Renaye Rakers [address], [place]   [phone] Only accepts [place] Access [place] patients after they have their name applied to their card.   Self-Pay (no insurance) in [facility]:  Organization         Address  Phone   Notes  Sickle Cell Patients, [facility] [address] [place], [place] [phone]   [facility] [address] [place], [place] [phone]   [facility] [place]  [address], [place] [phone]   [facility]/Dr. Osei-Bonsu  [address], [place] or [address], [place] [phone] Phone number for both [place] and [place] locations is the same.  Urgent Medical and [facility] [address], [place] [phone]   [facility] [address] [place] or [address] [phone]   [facility] [address] [place] [phone], phone; [phone], fax Sees patients 1st and 3rd Saturday of every month.  Must not  qualify for public or private insurance (i.e. Medicaid, Medicare, [place] Health Choice, Veterans' Benefits)  Household income should be no more than 200% of the poverty level The clinic cannot treat you if you are pregnant or think you are pregnant  Sexually transmitted diseases are not treated at the clinic.    Dental Care: Organization         Address  Phone  Notes  [facility] of [facility] [address] [place], [place] [phone]  Accepts children up to age 66 who are enrolled in Medicaid or [place] Health Choice; pregnant women with a Medicaid card; and children who have applied for Medicaid or [place] Health Choice, but were declined, whose parents can pay a reduced fee at time of service.  [facility] Department of [facility]  [address], [place] [phone] Accepts children up to age 74 who are enrolled in [place] or [place] Health Choice; pregnant women with a Medicaid card; and children who have applied for Medicaid or [place] Health Choice, but were declined, whose parents can pay a reduced fee at time of service.  [facility]  [address] [place], [place] [phone] Patients are seen by appointment only. Walk-ins are not accepted. [facility] will see patients 48 years of age and older. Monday - Tuesday (8am-5pm) Most Wednesdays (8:30-5pm) $30 per visit, cash only  [facility]  [address], [facility] [phone] Patients are seen by appointment only. Walk-ins are not accepted. [facility] will see patients 3 years of age and older. One Wednesday Evening (Monthly: Volunteer Based).  $30 per visit, cash only  [employer]  [phone] for adults; Children under age 32, call Graduate Pediatric Dentistry at [phone]. Children aged 40-14, please call [phone] to request a pediatric application.  Dental services are provided  in all areas of dental care including fillings, crowns and bridges, complete and partial dentures, implants, gum treatment, root canals, and extractions. Preventive care is also provided. Treatment is provided to both adults and children. Patients are selected via a lottery and there is often a waiting list.   [facility] [address] [place]  [phone] [email or web address]   [facility] [address] [place], [place] [phone], Ext. [phone] Second and Fourth Thursday of each month, opens at 6:30 AM; Clinic ends at 9 AM.  Patients are seen on a first-come first-served basis, and a limited number are seen during each clinic.   [facility]  [address] Ether Griffins [place], [place] [phone]   Eligibility Requirements You must have lived in [place], [place], or [place] counties for at least the last three months.   You cannot be eligible for state or federal sponsored [place], including [employer], [place], or [employer].   You generally cannot be eligible for healthcare insurance through your employer.    How to apply: Eligibility screenings are held every Tuesday and Wednesday afternoon from 1:00 pm until 4:00 pm. You do not need an appointment for the interview!  [facility] [address], [place], [place] [phone]   [facility] Health Department  [phone]   [facility] Health Department  [phone]   [facility] Health Department  [phone]    Behavioral Health Resources in the Community: Intensive Outpatient Programs Organization         Address  Phone  Notes  [facility] [address], [place], [place] [phone]   [facility] Outpatient [address] [place], [place] [phone]   [facility]: [facility] [address] [place], [place]  [phone]   [facility] [address],  [place], [place]  [phone] or [phone]   Substance Abuse Resources Organization         Address  Phone  Notes  Alcohol and Drug Services  9525317457  [place]  [phone]   [facility]   Chinita Pester  [phone]   Residential & Outpatient Substance Abuse Program  [phone]   Psychological Services Organization         Address  Phone  Notes  [facility] [place]  [place]  [phone]   [place] [address] [place] or [phone]    Mobile Crisis Teams Organization         Address  Phone  Notes  [facility], Mobile Crisis Care Unit  [phone]   [facility]  [address] [place], [place]   [place] [address] [place] [phone]    Self-Help/Support Groups Organization         Address  Phone             Notes  [place] of [place] - variety of support groups  [place] Call for more information  Narcotics Anonymous (NA), [facility] [address], [employer] [place]  2 meetings at this location   Special educational needs teacher         Address  Phone  Notes  [facility] [place],    [place]  [phone]   [facility]  [address], [place] [id number], [place], [place]   [place], [place] [phone] Admissions: 8am-3pm M-F  Incentives Substance [place] [address],    [place], [place] [phone]   [facility] [address] [place], [place], [place]   [facility] [address]  [place], [place]   [facility] - Intensive Outpatient [address] Kristeen Mans [address], [place], [place]   [facility] ([place].,  [place], [place] 1-3042676096 or  [phone]   [facility] ([facility]) [address], [place], [place] Accepts Medicaid  [facility] [place] [address],  [place] [phone] Substance Abuse/Addiction Treatment   [facility] Organization         Address  Phone  Notes  [facility]  3045935200   Domenic Schwab, PhD [address] Arlis Porta [place], [place]   [phone] or [phone]   [place], [place] [phone]   [facility] [address] [place], [place] [phone] Insurance/Medicaid/sponsorship through [facility] and Families [address]                                    [place], [place] [phone] [place] [address], [place] [phone]    Dr. Adele Schilder  [phone]   [facility] Dept. 1) [address], [place] 2) [place] 3)  [place] [address], [place] [phone]  [phone]   [place] [phone] or [phone] (After Hours)

## 2015-03-11 NOTE — ED Provider Notes (Signed)
CSN: [id number]     Arrival date & time 03/11/15  0805 History   First MD Initiated Contact with Patient 03/11/15 [phone]     Chief Complaint  Patient presents with  . Recurrent Skin Infections     (Consider location/radiation/quality/duration/timing/severity/associated sxs/prior Treatment) HPI Susan Schneider is a 25 y.o. female with PMH significant for recurrent abscess who presents with gradually worsening, painful, nondraining abscess to the right axilla noticed 4 days ago. Patient was seen here on the 18th after using warm compresses and Neosporin with mild relief. No incision and drainage was performed due to spontaneous drainage and moderate drainage with palpation.  Patient was prescribed Bactrim. Patient reports she has been taking the Bactrim as well as Tylenol for pain relief. She denies fever, chills, nausea, vomiting, or abdominal pain, or any other systemic symptoms. She reports that she is from [place], and requesting PCP referral.  Past Medical History  Diagnosis Date  . Headache     sinus   Past Surgical History  Procedure Laterality Date  . Cholecystectomy N/A 12/01/2013    Procedure: LAPAROSCOPIC CHOLECYSTECTOMY WITH INTRAOPERATIVE CHOLANGIOGRAM;  Surgeon: Atilano Ina, MD;  [facility];  Service: General;  Laterality: N/A;   No family history on file. Social History  Substance Use Topics  . Smoking status: Never Smoker   . Smokeless tobacco: Never Used  . Alcohol Use: No   OB History    No data available     Review of Systems All other systems negative unless otherwise stated in HPI    Allergies  Review of patient's allergies indicates no known allergies.  Home Medications   Prior to Admission medications   Medication Sig Start Date End Date Taking? Authorizing Provider  Levonorgestrel-Ethinyl Estradiol (AMETHIA) 0.15-0.03 &0.01 MG tablet Take 1 tablet by mouth daily.    Historical Provider, MD  promethazine (PHENERGAN) 25 MG tablet Take 1  tablet (25 mg total) by mouth every 6 (six) hours as needed for nausea or vomiting. 06/28/14   Rodolph Bong, MD  sulfamethoxazole-trimethoprim (BACTRIM DS,SEPTRA DS) 800-160 MG tablet Take 1 tablet by mouth 2 (two) times daily. 03/09/15 03/16/15  Rolm Gala Barrett, PA-C  triamcinolone cream (KENALOG) 0.1 % Apply 1 application topically 4 (four) times daily as needed (rash).  09/15/13   Historical Provider, MD   BP 151/72 mmHg  Pulse 83  Temp(Src) 98.2 F (36.8 C) (Oral)  Resp 16  SpO2 99%  LMP 03/05/2015 Physical Exam  Constitutional: She is oriented to person, place, and time. She appears well-developed and well-nourished.  HENT:  Head: Atraumatic.  Eyes: Conjunctivae are normal. No scleral icterus.  Neck: No tracheal deviation present.  Pulmonary/Chest: Effort normal. No respiratory distress.  Neurological: She is alert and oriented to person, place, and time.  Skin: Skin is warm and dry.  Right axilla: Linear 3 cm length area along sinus tract with  of induration with mild fluctuance. No erythema or active drainage. Moderately tender to palpation.  Psychiatric: She has a normal mood and affect. Her behavior is normal.    ED Course  Procedures (including critical care time)  INCISION AND DRAINAGE Performed by: Cheri Fowler Consent: Verbal consent obtained. Risks and benefits: risks, benefits and alternatives were discussed Type: abscess  Body area: right axilla  Anesthesia: local infiltration  Incision was made with a scalpel.  Local anesthetic: lidocaine 2% % with epinephrine  Anesthetic total: 2 ml  Complexity: complex Blunt dissection to break up loculations  Drainage: purulent  Drainage amount: Mild to moderate   Packing material: None   Patient tolerance: Patient tolerated the procedure well with no immediate complications.    Labs Review Labs Reviewed  I-STAT BETA HCG BLOOD, ED ([facility], [facility], [facility] ONLY)    Imaging Review No results found. I have personally reviewed  and evaluated these images and lab results as part of my medical decision-making.   EKG Interpretation None      MDM   Final diagnoses:  Abscess  Hidradenitis suppurativa of right axilla    Patient presents with recurrent abscess to right axilla consistent with hidradenitis suppurative.  Patient was seen on the 18th of this month (2 days ago) and prescribed Bactrim. Patient has been compliant with medication. Patient appears nontoxic, and in no acute distress. On exam, moderately tender 3 cm area of induration along sinus tract in right axilla area mild fluctuance. No surrounding erythema or active drainage. I&D performed with mild to moderate purulent drainage. We'll discharge home with clindamycin and ibuprofen. Upon discussing return precautions, patient voiced concern for pregnancy. We'll perform i-STAT beta hCG.  HCG <5.0, normal.  Evaluation does not show pathology requiring ongoing emergent intervention or admission. Pt is hemodynamically stable and mentating appropriately. Discussed findings/results and plan with patient/guardian, who agrees with plan. All questions answered. Return precautions discussed and outpatient follow up given.      Cheri Fowler, PA-C 03/11/15 4098  Laurence Spates, MD 03/11/15 [phone]

## 2015-03-11 NOTE — ED Notes (Addendum)
Pt c/o abscess to right underarm area for "some days." Pt seen here for same on the 18th and was told she had hidradenitis. Pt was told that she could push on the area and it would drain on its own, but it has not been draining. Pt needs list of PMD so that she can be referred to a surgeon.

## 2015-03-11 NOTE — ED Notes (Signed)
I&D kit, 25g and 18g at bedside

## 2015-11-23 ENCOUNTER — Encounter: Payer: Self-pay | Admitting: Endocrinology

## 2015-11-23 ENCOUNTER — Ambulatory Visit (INDEPENDENT_AMBULATORY_CARE_PROVIDER_SITE_OTHER): Payer: 59 | Admitting: Endocrinology

## 2015-11-23 DIAGNOSIS — D509 Iron deficiency anemia, unspecified: Secondary | ICD-10-CM

## 2015-11-23 DIAGNOSIS — E0590 Thyrotoxicosis, unspecified without thyrotoxic crisis or storm: Secondary | ICD-10-CM | POA: Diagnosis not present

## 2015-11-23 LAB — T4, FREE: Free T4: 0.9 ng/dL (ref 0.60–1.60)

## 2015-11-23 LAB — TSH: TSH: 0.54 u[IU]/mL (ref 0.35–4.50)

## 2015-11-23 NOTE — Patient Instructions (Addendum)
blood tests are requested for you today.  We'll let you know about the results.  

## 2015-11-23 NOTE — Progress Notes (Signed)
Subjective:    Patient ID: Susan GambleMarlayna J Flynn, female    DOB: [birth year], 25 y.o.   MRN: [id number]  HPI Pt is ref by Theda SersY Johnson, FNP, for abnormal TFT.  she reports he was dx'ed with hyperthyroidism in early 2017.  she has never been on therapy for this.  she has never had XRT to the anterior neck, or thyroid surgery.  she has never had thyroid imaging.  she does not consume kelp or any other prescribed or non-prescribed thyroid medication.  she has never been on amiodarone.  She has slight headache, worst at the bifrontal areas, and assoc menorrhagia.   Past Medical History:  Diagnosis Date  . Anemia, iron deficiency   . Headache    sinus  . Hyperthyroidism     Past Surgical History:  Procedure Laterality Date  . CHOLECYSTECTOMY N/A 12/01/2013   Procedure: LAPAROSCOPIC CHOLECYSTECTOMY WITH INTRAOPERATIVE CHOLANGIOGRAM;  Surgeon: Atilano InaEric M Wilson, MD;  [facility];  Service: General;  Laterality: N/A;    Social History   Social History  . Marital status: Single    Spouse name: N/A  . Number of children: N/A  . Years of education: N/A   Occupational History  . Not on file.   Social History Main Topics  . Smoking status: Never Smoker  . Smokeless tobacco: Never Used  . Alcohol use No  . Drug use: No  . Sexual activity: Not on file   Other Topics Concern  . Not on file   Social History Narrative  . No narrative on file    Current Outpatient Prescriptions on File Prior to Visit  Medication Sig Dispense Refill  . clindamycin (CLEOCIN) 150 MG capsule Take 2 capsules (300 mg total) by mouth 2 (two) times daily. 14 capsule 0  . clindamycin (CLINDAGEL) 1 % gel Apply topically 2 (two) times daily. Apply to affected area BID until resolved. 30 g 0  . ibuprofen (ADVIL,MOTRIN) 800 MG tablet Take 1 tablet (800 mg total) by mouth 3 (three) times daily. 21 tablet 0  . Levonorgestrel-Ethinyl Estradiol (AMETHIA) 0.15-0.03 &0.01 MG tablet Take 1 tablet by mouth daily.    .  promethazine (PHENERGAN) 25 MG tablet Take 1 tablet (25 mg total) by mouth every 6 (six) hours as needed for nausea or vomiting. 30 tablet 0  . triamcinolone cream (KENALOG) 0.1 % Apply 1 application topically 4 (four) times daily as needed (rash).      No current facility-administered medications on file prior to visit.     No Known Allergies  Family History  Problem Relation Age of Onset  . Hypothyroidism Father     BP 128/80   Pulse 85   Wt 224 lb 3.2 oz (101.7 kg)   SpO2 97%   BMI 36.19 kg/m   Review of Systems denies hoarseness, visual loss, palpitations, sob, diarrhea, polyuria, muscle weakness, excessive diaphoresis, tremor, anxiety, heat intolerance, easy bruising, and rhinorrhea.  She has lost 12 lbs, due to her efforts. She has slight anxiety.       Objective:   Physical Exam VS: see vs page GEN: no distress HEAD: head: no deformity eyes: no periorbital swelling, no proptosis external nose and ears are normal mouth: no lesion seen NECK: supple, thyroid is not enlarged CHEST WALL: no deformity LUNGS: clear to auscultation CV: reg rate and rhythm, no murmur ABD: abdomen is soft, nontender.  no hepatosplenomegaly.  not distended.  no hernia MUSCULOSKELETAL: muscle bulk and strength are grossly normal.  no  obvious joint swelling.  gait is normal and steady EXTEMITIES: no deformity.  no ulcer on the feet.  feet are of normal color and temp.  no edema PULSES: dorsalis pedis intact bilat.  no carotid bruit NEURO:  cn 2-12 grossly intact.   readily moves all 4's.  sensation is intact to touch on the feet.  No tremor.  SKIN:  Normal texture and temperature.  No rash or suspicious lesion is visible.  Not diaphoretic. NODES:  None palpable at the neck PSYCH: alert, well-oriented.  Does not appear anxious nor depressed.   I have reviewed outside records, and summarized: Pt was noted to have abnormal TFT, and ref here.  She was also noted to have irreg menses, and rx'ed  OC's.  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  No rx is needed now, but hyperthyroidism will likely recur with time.

## 2015-11-25 ENCOUNTER — Encounter: Payer: Self-pay | Admitting: Endocrinology

## 2015-11-25 DIAGNOSIS — D509 Iron deficiency anemia, unspecified: Secondary | ICD-10-CM | POA: Insufficient documentation

## 2015-11-28 ENCOUNTER — Ambulatory Visit (HOSPITAL_COMMUNITY)
Admission: EM | Admit: 2015-11-28 | Discharge: 2015-11-28 | Disposition: A | Discharge status: Home / Self Care | Payer: 59

## 2015-11-30 ENCOUNTER — Ambulatory Visit (HOSPITAL_COMMUNITY)
Admission: EM | Admit: 2015-11-30 | Discharge: 2015-11-30 | Disposition: A | Discharge status: Home / Self Care | Payer: Commercial Managed Care - HMO | Attending: Nurse Practitioner | Admitting: Nurse Practitioner

## 2015-11-30 ENCOUNTER — Encounter (HOSPITAL_COMMUNITY): Payer: Self-pay | Admitting: Family Medicine

## 2015-11-30 DIAGNOSIS — L2489 Irritant contact dermatitis due to other agents: Secondary | ICD-10-CM

## 2015-11-30 MED ORDER — TRIAMCINOLONE ACETONIDE 0.1 % EX CREA: 1.0000 "application " | TOPICAL_CREAM | Freq: Two times a day (BID) | CUTANEOUS | 0 refills | Status: AC

## 2015-11-30 NOTE — ED Triage Notes (Signed)
Pt here for rash to right FA where her tattoo is. sts she had the tattoo done in early September. sts very itchy and has been using Benadryl.

## 2015-11-30 NOTE — Discharge Instructions (Signed)
Apply the cream on the rash twice a day for 7 days. Continue to take benadryl for itchiness. Please return if rash does not improve.

## 2015-11-30 NOTE — ED Provider Notes (Signed)
CSN: [id number]     Arrival date & time 11/30/15  1006 History   First MD Initiated Contact with Patient 11/30/15 1058     Chief Complaint  Patient presents with  . Rash   (Consider location/radiation/quality/duration/timing/severity/associated sxs/prior Treatment) Patient is a 24-y.o female with history of headache, hyperthyroidism, and IDA, presents today for a rash to her right forearm. She got a new colored tattoo back in mid September and she believes she got allergic to the tattoo. Patient have had other tattoos but this is the first colored tattoo. Patient reports the rash came on 2 weeks after the tattoo and the rash is localized only at the tattoo. She reports the rash to be itchy. She have been taking benadryl for itchiness and is helping. She also tried the OTC hydrocortisone 1% cream with no improvement in rash. She denies any other complaints.       Past Medical History:  Diagnosis Date  . Anemia, iron deficiency   . Headache    sinus  . Hyperthyroidism    Past Surgical History:  Procedure Laterality Date  . CHOLECYSTECTOMY N/A 12/01/2013   Procedure: LAPAROSCOPIC CHOLECYSTECTOMY WITH INTRAOPERATIVE CHOLANGIOGRAM;  Surgeon: Atilano InaEric M Wilson, MD;  [facility];  Service: General;  Laterality: N/A;   Family History  Problem Relation Age of Onset  . Hypothyroidism Father    Social History  Substance Use Topics  . Smoking status: Never Smoker  . Smokeless tobacco: Never Used  . Alcohol use No   OB History    No data available     Review of Systems  All other systems reviewed and are negative.   Allergies  Review of patient's allergies indicates no known allergies.  Home Medications   Prior to Admission medications   Medication Sig Start Date End Date Taking? Authorizing Provider  triamcinolone cream (KENALOG) 0.1 % Apply 1 application topically 2 (two) times daily. 11/30/15 12/07/15  Lucia EstelleFeng Griffyn Kucinski, NP   Meds Ordered and Administered this Visit  Medications  - No data to display  BP 127/81 (BP Location: Left Arm)   Pulse 76   Temp 98.6 F (37 C) (Oral)   Resp 16   LMP 11/30/2015   SpO2 100%  No data found.   Physical Exam  Constitutional: She is oriented to person, place, and time. She appears well-developed and well-nourished.  HENT:  Head: Normocephalic and atraumatic.  Cardiovascular: Normal rate.   Pulmonary/Chest: Effort normal.  Neurological: She is alert and oriented to person, place, and time.  Skin:  See picture below. Unable to assess the color of the rash due to the tattoo ink being red as well. Rash are confluent papules. No discharge. No swelling of the extremity. No rash noted on other body surfaces.   Nursing note and vitals reviewed.     Urgent Care Course   Clinical Course    Procedures (including critical care time)  Labs Review Labs Reviewed - No data to display  Imaging Review No results found.    MDM   1. Irritant contact dermatitis due to other agents    Prescription for Kenalog 0.1% given to apply twice daily for 7 days. Reviewed directions for usage and side effects. Patient states understanding and will call with questions or problems. Patient instructed to return if the rash does not improve. Establishing care with a PCP recommended. Patient denies any questions. Discharge paperwork given.    Lucia EstelleFeng Shilo Philipson, NP 11/30/15 1116

## 2016-09-30 ENCOUNTER — Encounter (HOSPITAL_COMMUNITY): Payer: Self-pay

## 2016-09-30 ENCOUNTER — Emergency Department (HOSPITAL_COMMUNITY)
Admission: EM | Admit: 2016-09-30 | Discharge: 2016-09-30 | Disposition: A | Discharge status: Home / Self Care | Payer: 59 | Attending: Emergency Medicine | Admitting: Emergency Medicine

## 2016-09-30 DIAGNOSIS — Z3A10 10 weeks gestation of pregnancy: Secondary | ICD-10-CM | POA: Diagnosis not present

## 2016-09-30 DIAGNOSIS — O209 Hemorrhage in early pregnancy, unspecified: Secondary | ICD-10-CM | POA: Diagnosis not present

## 2016-09-30 DIAGNOSIS — Z79899 Other long term (current) drug therapy: Secondary | ICD-10-CM | POA: Insufficient documentation

## 2016-09-30 DIAGNOSIS — N938 Other specified abnormal uterine and vaginal bleeding: Secondary | ICD-10-CM | POA: Diagnosis present

## 2016-09-30 NOTE — ED Provider Notes (Signed)
[facility]-EMERGENCY DEPT Provider Note   CSN: [id number] Arrival date & time: 09/30/16  1301     History   Chief Complaint Chief Complaint  Patient presents with  . Vaginal Bleeding    [redacted] weeks pregnant    HPI Susan Schneider is a 26 y.o. G1P0 @ 10 weeks 5 days gestation by ultrasound done 09/26/16 LMP 07/09/16. Patient reports vaginal bleeding that started today. The bleeding is just a little lighter than a period. She reports passing clots and feeling pressure. Patient was evaluated by [facility] on 8/8 and had cultures and pap smear done at that time. Patient reports that they also did blood work. On review of chart the patient's blood type is O positive.   The history is provided by the patient. No language interpreter was used.  Vaginal Bleeding  Primary symptoms include discharge, vaginal bleeding.  Primary symptoms include no dyspareunia, no dysuria. There has been no fever. This is a new problem. She is pregnant. Vaginal discharge characteristics: currently being treated for BV. Associated symptoms include nausea and frequency. Pertinent negatives include no abdominal pain and no vomiting. She has tried nothing for the symptoms. Associated medical issues include STD (Chlamydia) and vaginosis. Associated medical issues do not include ovarian cysts, ectopic pregnancy, miscarriage or terminated pregnancy.    Past Medical History:  Diagnosis Date  . Anemia, iron deficiency   . Headache    sinus  . Hyperthyroidism     Patient Active Problem List   Diagnosis Date Noted  . Anemia, iron deficiency   . Hyperthyroidism 11/23/2015    Past Surgical History:  Procedure Laterality Date  . CHOLECYSTECTOMY N/A 12/01/2013   Procedure: LAPAROSCOPIC CHOLECYSTECTOMY WITH INTRAOPERATIVE CHOLANGIOGRAM;  Surgeon: Atilano Ina, MD;  Location: [facility] OR;  Service: General;  Laterality: N/A;    OB History    Gravida Para Term Preterm AB Living   1             SAB TAB Ectopic Multiple Live  Births                   Home Medications    Prior to Admission medications   Medication Sig Start Date End Date Taking? Authorizing Provider  acetaminophen (TYLENOL) 325 MG tablet Take 650 mg by mouth every 6 (six) hours as needed.   Yes [provider]  diphenhydrAMINE (SOMINEX) 25 MG tablet Take 25 mg by mouth at bedtime as needed for sleep.   Yes [provider]  DOCOSAHEXAENOIC ACID PO Take 1 tablet by mouth daily. 08/28/16  Yes [provider]  metroNIDAZOLE (METROGEL) 0.75 % vaginal gel Place 1 application vaginally daily. 09/26/16  Yes [provider]    Family History Family History  Problem Relation Age of Onset  . Hypothyroidism Father     Social History Social History  Substance Use Topics  . Smoking status: Never Smoker  . Smokeless tobacco: Never Used  . Alcohol use No     Allergies   Patient has no known allergies.   Review of Systems Review of Systems  Constitutional: Negative for chills and fever.  HENT: Positive for congestion.   Eyes: Negative for visual disturbance.  Gastrointestinal: Positive for nausea. Negative for abdominal pain and vomiting.  Genitourinary: Positive for frequency and vaginal bleeding. Negative for dyspareunia, dysuria and urgency.  Musculoskeletal: Negative for back pain.  Skin: Negative for rash.  Neurological: Negative for syncope and headaches.  Psychiatric/Behavioral: The patient is not nervous/anxious.  Physical Exam Updated Vital Signs BP 117/87 (BP Location: Left Arm)   Pulse 100   Temp 98.3 F (36.8 C) (Oral)   Resp 12   Ht 5\' 6"  (1.676 m)   Wt 98 kg (216 lb)   LMP 11/30/2015   SpO2 100%   BMI 34.86 kg/m   Physical Exam  Constitutional: She is oriented to person, place, and time. She appears well-developed and well-nourished. No distress.  HENT:  Head: Normocephalic.  Eyes: EOM are normal.  Neck: Neck supple.  Cardiovascular: Normal rate.   Pulmonary/Chest:  Effort normal.  Abdominal: Soft. There is no tenderness.  Genitourinary:  Genitourinary Comments: External genitalia without lesions. Scant dark blood vaginal vault. Cervix long, closed. No CMT, no adnexal tenderness, uterus approximately 10 week size.   Musculoskeletal: Normal range of motion.  Neurological: She is alert and oriented to person, place, and time. No cranial nerve deficit.  Skin: Skin is warm and dry.  Psychiatric: She has a normal mood and affect. Her behavior is normal.  Nursing note and vitals reviewed.  Ultrasound by me at bedside, viable IUP @ 10 weeks 5 days gestation. Patient able to see heart beat and feels reassured.   ED Treatments / Results  Labs (all labs ordered are listed, but only abnormal results are displayed) Labs Reviewed - No data to display  Radiology No results found.  Procedures Procedures (including critical care time)  Medications Ordered in ED Medications - No data to display   Initial Impression / Assessment and Plan / ED Course  I have reviewed the triage vital signs and the nursing notes.  Pertinent labs & imaging results that were available during my care of the patient were reviewed by me and considered in my medical decision making (see chart for details).   Final Clinical Impressions(s) / ED Diagnoses  26 y.o. female with vaginal bleeding that had decreased stable for d/c without hemorrhage, blood type O positive. Ultrasound by me shows viable IUP. Patient to f/u with [facility]. Return precautions discussed.   Final diagnoses:  Vaginal bleeding in pregnancy, first trimester    New Prescriptions New Prescriptions   No medications on file     Janne Napoleoneese, [place] M, NP 09/30/16 1528    Melene PlanFloyd, Dan, DO 09/30/16 1705

## 2016-09-30 NOTE — ED Triage Notes (Signed)
Pt is [redacted] weeks pregnant in her first pregnancy and c/o vaginal bleeding with small clots when she wiped about one hour ago. She reports a dull ache in her lower abdomen. A&Ox4. Ambulatory.
